# Patient Record
Sex: Female | Born: 1985 | Race: Black or African American | Hispanic: No | Marital: Single | Smoking: Current every day smoker
Health system: Southern US, Community
[De-identification: ages and names within clinical notes are randomized; demographics above are authoritative.]

---
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
## 2003-05-23 ENCOUNTER — Emergency Department (HOSPITAL_COMMUNITY): Admission: EM | Admit: 2003-05-23 | Discharge: 2003-05-23 | Payer: Self-pay | Admitting: *Deleted

## 2003-06-23 ENCOUNTER — Emergency Department (HOSPITAL_COMMUNITY): Admission: EM | Admit: 2003-06-23 | Discharge: 2003-06-24 | Payer: Self-pay | Admitting: Emergency Medicine

## 2004-04-23 ENCOUNTER — Emergency Department (HOSPITAL_COMMUNITY): Admission: EM | Admit: 2004-04-23 | Discharge: 2004-04-23 | Payer: Self-pay | Admitting: *Deleted

## 2004-09-01 ENCOUNTER — Emergency Department (HOSPITAL_COMMUNITY): Admission: EM | Admit: 2004-09-01 | Discharge: 2004-09-02 | Payer: Self-pay | Admitting: Emergency Medicine

## 2004-11-01 ENCOUNTER — Emergency Department (HOSPITAL_COMMUNITY): Admission: EM | Admit: 2004-11-01 | Discharge: 2004-11-01 | Payer: Self-pay | Admitting: Emergency Medicine

## 2005-01-21 ENCOUNTER — Emergency Department (HOSPITAL_COMMUNITY): Admission: EM | Admit: 2005-01-21 | Discharge: 2005-01-22 | Payer: Self-pay | Admitting: Emergency Medicine

## 2005-08-14 ENCOUNTER — Emergency Department (HOSPITAL_COMMUNITY): Admission: EM | Admit: 2005-08-14 | Discharge: 2005-08-14 | Payer: Self-pay | Admitting: Family Medicine

## 2005-08-19 ENCOUNTER — Ambulatory Visit: Payer: Self-pay | Admitting: *Deleted

## 2007-03-14 ENCOUNTER — Emergency Department: Payer: Self-pay | Admitting: Emergency Medicine

## 2007-04-16 ENCOUNTER — Emergency Department: Payer: Self-pay | Admitting: Emergency Medicine

## 2007-04-17 ENCOUNTER — Emergency Department: Payer: Self-pay | Admitting: Emergency Medicine

## 2008-09-11 ENCOUNTER — Emergency Department (HOSPITAL_COMMUNITY): Admission: EM | Admit: 2008-09-11 | Discharge: 2008-09-11 | Payer: Self-pay | Admitting: Emergency Medicine

## 2008-09-12 ENCOUNTER — Emergency Department (HOSPITAL_COMMUNITY): Admission: EM | Admit: 2008-09-12 | Discharge: 2008-09-12 | Payer: Self-pay | Admitting: Emergency Medicine

## 2009-01-18 ENCOUNTER — Emergency Department (HOSPITAL_COMMUNITY): Admission: EM | Admit: 2009-01-18 | Discharge: 2009-01-18 | Payer: Self-pay | Admitting: Emergency Medicine

## 2009-05-06 ENCOUNTER — Emergency Department (HOSPITAL_COMMUNITY): Admission: EM | Admit: 2009-05-06 | Discharge: 2009-05-07 | Payer: Self-pay | Admitting: Emergency Medicine

## 2009-05-09 ENCOUNTER — Emergency Department (HOSPITAL_COMMUNITY): Admission: EM | Admit: 2009-05-09 | Discharge: 2009-05-09 | Payer: Self-pay | Admitting: Emergency Medicine

## 2009-11-29 ENCOUNTER — Emergency Department (HOSPITAL_COMMUNITY): Admission: EM | Admit: 2009-11-29 | Discharge: 2009-11-29 | Payer: Self-pay | Admitting: Emergency Medicine

## 2010-09-03 LAB — DIFFERENTIAL
Basophils Absolute: 0.1 10*3/uL (ref 0.0–0.1)
Basophils Relative: 0 % (ref 0–1)
Lymphocytes Relative: 23 % (ref 12–46)
Monocytes Absolute: 1 10*3/uL (ref 0.1–1.0)
Neutro Abs: 10.4 10*3/uL — ABNORMAL HIGH (ref 1.7–7.7)

## 2010-09-03 LAB — BASIC METABOLIC PANEL
BUN: 6 mg/dL (ref 6–23)
CO2: 27 mEq/L (ref 19–32)
Calcium: 8.3 mg/dL — ABNORMAL LOW (ref 8.4–10.5)
Creatinine, Ser: 0.67 mg/dL (ref 0.4–1.2)
GFR calc non Af Amer: >60 mL/min (ref >60)
Glucose, Bld: 84 mg/dL (ref 70–99)

## 2010-09-03 LAB — MONONUCLEOSIS SCREEN: Mono Screen: NEGATIVE

## 2010-09-03 LAB — CBC
MCHC: 32.7 g/dL (ref 30.0–36.0)
Platelets: 297 10*3/uL (ref 150–400)
RDW: 18.1 % — ABNORMAL HIGH (ref 11.5–15.5)

## 2010-09-03 LAB — RAPID STREP SCREEN (MED CTR MEBANE ONLY): Streptococcus, Group A Screen (Direct): NEGATIVE

## 2010-09-11 LAB — URINALYSIS, ROUTINE W REFLEX MICROSCOPIC
Bilirubin Urine: NEGATIVE
Nitrite: NEGATIVE
Protein, ur: NEGATIVE mg/dL
Specific Gravity, Urine: 1.02 (ref 1.005–1.030)
Urobilinogen, UA: 1 mg/dL (ref 0.0–1.0)

## 2010-09-11 LAB — URINE MICROSCOPIC-ADD ON

## 2011-02-11 ENCOUNTER — Encounter (HOSPITAL_COMMUNITY): Payer: Self-pay | Admitting: Radiology

## 2011-02-11 ENCOUNTER — Emergency Department (HOSPITAL_COMMUNITY)
Admission: EM | Admit: 2011-02-11 | Discharge: 2011-02-11 | Disposition: A | Discharge status: Home / Self Care | Payer: Self-pay | Attending: Emergency Medicine | Admitting: Emergency Medicine

## 2011-02-11 ENCOUNTER — Emergency Department (HOSPITAL_COMMUNITY): Payer: Self-pay

## 2011-02-11 DIAGNOSIS — R079 Chest pain, unspecified: Secondary | ICD-10-CM | POA: Insufficient documentation

## 2011-02-11 DIAGNOSIS — R042 Hemoptysis: Secondary | ICD-10-CM | POA: Insufficient documentation

## 2011-02-11 DIAGNOSIS — S02401A Maxillary fracture, unspecified, initial encounter for closed fracture: Secondary | ICD-10-CM | POA: Insufficient documentation

## 2011-02-11 DIAGNOSIS — S02400A Malar fracture unspecified, initial encounter for closed fracture: Secondary | ICD-10-CM | POA: Insufficient documentation

## 2011-02-11 DIAGNOSIS — J45909 Unspecified asthma, uncomplicated: Secondary | ICD-10-CM | POA: Insufficient documentation

## 2011-02-11 DIAGNOSIS — R42 Dizziness and giddiness: Secondary | ICD-10-CM | POA: Insufficient documentation

## 2011-02-11 LAB — POCT PREGNANCY, URINE: Preg Test, Ur: NEGATIVE

## 2011-03-04 ENCOUNTER — Emergency Department (HOSPITAL_COMMUNITY)
Admission: EM | Admit: 2011-03-04 | Discharge: 2011-03-05 | Disposition: A | Discharge status: Home / Self Care | Payer: Self-pay | Attending: Emergency Medicine | Admitting: Emergency Medicine

## 2011-03-04 DIAGNOSIS — IMO0002 Reserved for concepts with insufficient information to code with codable children: Secondary | ICD-10-CM | POA: Insufficient documentation

## 2011-03-04 DIAGNOSIS — M542 Cervicalgia: Secondary | ICD-10-CM | POA: Insufficient documentation

## 2011-03-04 DIAGNOSIS — S0003XA Contusion of scalp, initial encounter: Secondary | ICD-10-CM | POA: Insufficient documentation

## 2011-03-04 DIAGNOSIS — R51 Headache: Secondary | ICD-10-CM | POA: Insufficient documentation

## 2011-03-04 DIAGNOSIS — S0083XA Contusion of other part of head, initial encounter: Secondary | ICD-10-CM | POA: Insufficient documentation

## 2011-03-05 ENCOUNTER — Emergency Department (HOSPITAL_COMMUNITY): Payer: Self-pay

## 2011-04-28 ENCOUNTER — Encounter (HOSPITAL_COMMUNITY): Payer: Self-pay | Admitting: Emergency Medicine

## 2011-04-28 ENCOUNTER — Emergency Department (HOSPITAL_COMMUNITY)
Admission: EM | Admit: 2011-04-28 | Discharge: 2011-04-29 | Disposition: A | Discharge status: Home / Self Care | Payer: Self-pay | Attending: Emergency Medicine | Admitting: Emergency Medicine

## 2011-04-28 DIAGNOSIS — M79609 Pain in unspecified limb: Secondary | ICD-10-CM | POA: Insufficient documentation

## 2011-04-28 DIAGNOSIS — S1093XA Contusion of unspecified part of neck, initial encounter: Secondary | ICD-10-CM | POA: Insufficient documentation

## 2011-04-28 DIAGNOSIS — T148XXA Other injury of unspecified body region, initial encounter: Secondary | ICD-10-CM | POA: Insufficient documentation

## 2011-04-28 DIAGNOSIS — R51 Headache: Secondary | ICD-10-CM | POA: Insufficient documentation

## 2011-04-28 DIAGNOSIS — S0003XA Contusion of scalp, initial encounter: Secondary | ICD-10-CM | POA: Insufficient documentation

## 2011-04-28 DIAGNOSIS — S0990XA Unspecified injury of head, initial encounter: Secondary | ICD-10-CM | POA: Insufficient documentation

## 2011-04-28 DIAGNOSIS — S0083XA Contusion of other part of head, initial encounter: Secondary | ICD-10-CM

## 2011-04-28 DIAGNOSIS — S0510XA Contusion of eyeball and orbital tissues, unspecified eye, initial encounter: Secondary | ICD-10-CM | POA: Insufficient documentation

## 2011-04-28 DIAGNOSIS — W503XXA Accidental bite by another person, initial encounter: Secondary | ICD-10-CM

## 2011-04-28 DIAGNOSIS — J45909 Unspecified asthma, uncomplicated: Secondary | ICD-10-CM | POA: Insufficient documentation
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
## 2011-04-28 NOTE — ED Notes (Signed)
PT Given ice pack

## 2011-04-29 MED ORDER — OXYCODONE-ACETAMINOPHEN 5-325 MG PO TABS: 1.0000 | ORAL_TABLET | ORAL | Status: AC | PRN

## 2011-04-29 MED ORDER — OXYCODONE-ACETAMINOPHEN 5-325 MG PO TABS
1.0000 | ORAL_TABLET | Freq: Once | ORAL | Status: AC
Start: 2011-04-29 — End: 2011-04-29
  Administered 2011-04-29: 1 via ORAL
  Filled 2011-04-29: qty 1

## 2011-04-29 MED ORDER — CLINDAMYCIN HCL 300 MG PO CAPS: 300.0000 mg | ORAL_CAPSULE | Freq: Four times a day (QID) | ORAL | Status: AC
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
## 2011-04-29 NOTE — ED Notes (Signed)
Pt. Refused CT scans
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
## 2011-04-29 NOTE — ED Notes (Signed)
Pt. Discharged to home, pt. Alert and oriented, ambulatory, gait steady, NAD noted

## 2011-07-03 ENCOUNTER — Emergency Department (HOSPITAL_COMMUNITY)
Admission: EM | Admit: 2011-07-03 | Discharge: 2011-07-03 | Disposition: A | Discharge status: Home / Self Care | Payer: Self-pay | Attending: Emergency Medicine | Admitting: Emergency Medicine

## 2011-07-03 ENCOUNTER — Encounter (HOSPITAL_COMMUNITY): Payer: Self-pay

## 2011-07-03 DIAGNOSIS — F17200 Nicotine dependence, unspecified, uncomplicated: Secondary | ICD-10-CM | POA: Insufficient documentation

## 2011-07-03 DIAGNOSIS — R599 Enlarged lymph nodes, unspecified: Secondary | ICD-10-CM | POA: Insufficient documentation

## 2011-07-03 DIAGNOSIS — J029 Acute pharyngitis, unspecified: Secondary | ICD-10-CM | POA: Insufficient documentation

## 2011-07-03 DIAGNOSIS — J45909 Unspecified asthma, uncomplicated: Secondary | ICD-10-CM | POA: Insufficient documentation

## 2011-07-03 MED ORDER — OXYCODONE-ACETAMINOPHEN 5-325 MG PO TABS
1.0000 | ORAL_TABLET | Freq: Once | ORAL | Status: AC
  Administered 2011-07-03: 1 via ORAL
  Filled 2011-07-03: qty 1

## 2011-07-03 MED ORDER — HYDROCODONE-ACETAMINOPHEN 5-500 MG PO TABS: 2.0000 | ORAL_TABLET | Freq: Four times a day (QID) | ORAL | Status: DC | PRN

## 2011-07-03 MED ORDER — AZITHROMYCIN 250 MG PO TABS: 250.0000 mg | ORAL_TABLET | Freq: Every day | ORAL | Status: AC

## 2011-07-03 NOTE — ED Notes (Signed)
Sore throat for 2 weeks, pt. Has a hx of  Strep throat, and tonsilitis

## 2011-07-03 NOTE — ED Provider Notes (Signed)
History     CSN: [id number]  Arrival date & time 07/03/11  1816   First MD Initiated Contact with Patient 07/03/11 1849      Chief Complaint  Patient presents with  . Sore Throat    (Consider location/radiation/quality/duration/timing/severity/associated sxs/prior treatment) Patient is a 26 y.o. female presenting with pharyngitis.  Sore Throat This is a recurrent problem. The current episode started 1 to 4 weeks ago. The problem occurs constantly. The problem has been gradually worsening. Associated symptoms include a sore throat and swollen glands. The symptoms are aggravated by swallowing. She has tried nothing for the symptoms.    Past Medical History  Diagnosis Date  . Asthma     History reviewed. No pertinent past surgical history.  History reviewed. No pertinent family history.  History  Substance Use Topics  . Smoking status: Current Everyday Smoker  . Smokeless tobacco: Not on file  . Alcohol Use: Yes    OB History    Grav Para Term Preterm Abortions TAB SAB Ect Mult Living                  Review of Systems  HENT: Positive for sore throat.   All other systems reviewed and are negative.    Allergies  Penicillins  Home Medications  No current outpatient prescriptions on file.  BP 111/71  Pulse 95  Temp(Src) 98.7 F (37.1 C) (Oral)  Resp 12  Ht 5\' 6"  (1.676 m)  Wt 140 lb (63.504 kg)  BMI 22.60 kg/m2  SpO2 98%  LMP 06/17/2011  Physical Exam  Nursing note and vitals reviewed. Constitutional: She is oriented to person, place, and time. She appears well-developed and well-nourished.  HENT:  Head: Normocephalic.  Mouth/Throat: Oropharyngeal exudate present.  Eyes: Pupils are equal, round, and reactive to light.  Neck: Normal range of motion. Neck supple.  Cardiovascular: Normal rate, regular rhythm, normal heart sounds and intact distal pulses.   Pulmonary/Chest: Effort normal and breath sounds normal.  Abdominal: Soft. Bowel sounds are  normal.  Musculoskeletal: Normal range of motion.  Neurological: She is alert and oriented to person, place, and time.  Skin: Skin is warm and dry.  Psychiatric: She has a normal mood and affect.    ED Course  Procedures (including critical care time)   Labs Reviewed  RAPID STREP SCREEN   No results found.   No diagnosis found.   Pharyngitis. MDM          Jimmye Norman, NP 07/03/11 2039

## 2011-07-04 LAB — STREP A DNA PROBE: Group A Strep Probe: NEGATIVE

## 2011-07-04 NOTE — ED Provider Notes (Signed)
Medical screening examination/treatment/procedure(s) were performed by non-physician practitioner and as supervising physician I was immediately available for consultation/collaboration.   Anwar Crill A. Jermon Chalfant, MD 07/04/11 1454 

## 2011-07-12 ENCOUNTER — Emergency Department (HOSPITAL_COMMUNITY)
Admission: EM | Admit: 2011-07-12 | Discharge: 2011-07-12 | Disposition: A | Discharge status: Home / Self Care | Payer: Self-pay | Attending: Emergency Medicine | Admitting: Emergency Medicine

## 2011-07-12 ENCOUNTER — Encounter (HOSPITAL_COMMUNITY): Payer: Self-pay | Admitting: *Deleted

## 2011-07-12 DIAGNOSIS — R45851 Suicidal ideations: Secondary | ICD-10-CM | POA: Insufficient documentation

## 2011-07-12 DIAGNOSIS — R000 Tachycardia, unspecified: Secondary | ICD-10-CM | POA: Insufficient documentation

## 2011-07-12 DIAGNOSIS — F17200 Nicotine dependence, unspecified, uncomplicated: Secondary | ICD-10-CM | POA: Insufficient documentation

## 2011-07-12 DIAGNOSIS — F3289 Other specified depressive episodes: Secondary | ICD-10-CM | POA: Insufficient documentation

## 2011-07-12 DIAGNOSIS — F329 Major depressive disorder, single episode, unspecified: Secondary | ICD-10-CM | POA: Insufficient documentation

## 2011-07-12 DIAGNOSIS — F1910 Other psychoactive substance abuse, uncomplicated: Secondary | ICD-10-CM | POA: Insufficient documentation

## 2011-07-12 LAB — COMPREHENSIVE METABOLIC PANEL
ALT: 32 U/L (ref 0–35)
AST: 29 U/L (ref 0–37)
Albumin: 4.2 g/dL (ref 3.5–5.2)
Calcium: 9.4 mg/dL (ref 8.4–10.5)
Creatinine, Ser: 0.68 mg/dL (ref 0.50–1.10)
GFR calc non Af Amer: >90 mL/min (ref >90)
Sodium: 140 mEq/L (ref 135–145)
Total Protein: 8.3 g/dL (ref 6.0–8.3)

## 2011-07-12 LAB — RAPID URINE DRUG SCREEN, HOSP PERFORMED
Amphetamines: NOT DETECTED
Barbiturates: NOT DETECTED
Benzodiazepines: NOT DETECTED
Cocaine: NOT DETECTED
Opiates: NOT DETECTED
Tetrahydrocannabinol: POSITIVE — AB

## 2011-07-12 LAB — CBC
MCH: 29.9 pg (ref 26.0–34.0)
MCV: 86.9 fL (ref 78.0–100.0)
Platelets: 442 10*3/uL — ABNORMAL HIGH (ref 150–400)
RBC: 4.28 MIL/uL (ref 3.87–5.11)
RDW: 15.5 % (ref 11.5–15.5)

## 2011-07-12 MED ORDER — ACETAMINOPHEN 325 MG PO TABS: 650.0000 mg | ORAL_TABLET | ORAL | Status: DC | PRN

## 2011-07-12 MED ORDER — IBUPROFEN 600 MG PO TABS: 600.0000 mg | ORAL_TABLET | Freq: Three times a day (TID) | ORAL | Status: DC | PRN

## 2011-07-12 MED ORDER — NICOTINE 21 MG/24HR TD PT24: 21.0000 mg | MEDICATED_PATCH | Freq: Every day | TRANSDERMAL | Status: DC

## 2011-07-12 MED ORDER — ZIPRASIDONE MESYLATE 20 MG IM SOLR
INTRAMUSCULAR | Status: AC
  Filled 2011-07-12: qty 20

## 2011-07-12 MED ORDER — ONDANSETRON HCL 4 MG PO TABS: 4.0000 mg | ORAL_TABLET | Freq: Three times a day (TID) | ORAL | Status: DC | PRN

## 2011-07-12 MED ORDER — ZIPRASIDONE MESYLATE 20 MG IM SOLR
10.0000 mg | Freq: Once | INTRAMUSCULAR | Status: AC
  Administered 2011-07-12: 03:00:00 via INTRAMUSCULAR

## 2011-07-12 MED ORDER — LORAZEPAM 1 MG PO TABS: 1.0000 mg | ORAL_TABLET | Freq: Three times a day (TID) | ORAL | Status: DC | PRN

## 2011-07-12 MED ORDER — ALUM & MAG HYDROXIDE-SIMETH 200-200-20 MG/5ML PO SUSP: 30.0000 mL | ORAL | Status: DC | PRN

## 2011-07-12 NOTE — ED Notes (Signed)
Belongings returned, pt denies si/hi/avh at this time. Pt encouraged to return for any return of suicidal thoughts/urges.  Pt verbalized understanding.

## 2011-07-12 NOTE — ED Notes (Signed)
telepsych info faxed 
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
## 2011-07-12 NOTE — ED Notes (Signed)
Awake,  Drinking soda, waiting for telepsych
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
## 2011-07-12 NOTE — ED Notes (Signed)
Lab bedside.

## 2011-07-12 NOTE — ED Notes (Signed)
Vital signs stable. 
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
## 2011-07-12 NOTE — ED Notes (Signed)
Patient is resting comfortably. 

## 2011-07-12 NOTE — ED Notes (Signed)
Pt states that she sometimes feels hopeless and when she drinks ETOH, she often thinks about hurting or killing herself.  Pt states that she has always had such feelings but has never sought help as she feels that she can change her thinking without medication.  Pt unaware of family hx of depression or other psychiatric illnesses as she just met her father for the first time x 1 week ago and her mother has been deported.  Pt is tearful at time of questioning and states that she wishes to leave and that we cannot make her stay.  Pt informed that she must stay because she has been involuntarily committed.  Pt attempts to leave and is escorted back to her room via [employer].  Pt still tries to leave and is given Geodon.
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
## 2011-07-12 NOTE — ED Notes (Signed)
ACT into seee

## 2011-07-12 NOTE — ED Notes (Signed)
Up to the desk on the phone 

## 2011-07-12 NOTE — ED Notes (Signed)
telepsych complete, pt tolerated well

## 2011-07-12 NOTE — ED Notes (Signed)
Pt given community referals for follow up

## 2011-07-12 NOTE — ED Notes (Signed)
Up to the bathroom 

## 2011-07-12 NOTE — ED Notes (Signed)
Patient denies pain and is resting comfortably.  
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
## 2011-12-02 ENCOUNTER — Encounter (HOSPITAL_COMMUNITY): Payer: Self-pay | Admitting: *Deleted

## 2011-12-02 ENCOUNTER — Emergency Department (HOSPITAL_COMMUNITY)
Admission: EM | Admit: 2011-12-02 | Discharge: 2011-12-02 | Disposition: A | Discharge status: Home / Self Care | Payer: Self-pay | Attending: Emergency Medicine | Admitting: Emergency Medicine

## 2011-12-02 DIAGNOSIS — F17200 Nicotine dependence, unspecified, uncomplicated: Secondary | ICD-10-CM | POA: Insufficient documentation

## 2011-12-02 DIAGNOSIS — J020 Streptococcal pharyngitis: Secondary | ICD-10-CM | POA: Insufficient documentation

## 2011-12-02 MED ORDER — HYDROCODONE-ACETAMINOPHEN 7.5-500 MG/15ML PO SOLN: 15.0000 mL | Freq: Four times a day (QID) | ORAL | Status: DC | PRN

## 2011-12-02 MED ORDER — AZITHROMYCIN 250 MG PO TABS: 250.0000 mg | ORAL_TABLET | Freq: Every day | ORAL | Status: AC

## 2011-12-02 NOTE — ED Notes (Signed)
Patient reports onset of sore throat and white patches on her throat since Sunday.  She denies fever

## 2011-12-02 NOTE — ED Provider Notes (Signed)
History   This chart was scribed for Gwyneth Sprout, MD by Melba Coon. The patient was seen in room TR11C/TR11C and the patient's care was started at 1:45PM.    CSN: [id number]  Arrival date & time 12/02/11  1327   First MD Initiated Contact with Patient 12/02/11 1337      Chief Complaint  Patient presents with  . Sore Throat    (Consider location/radiation/quality/duration/timing/severity/associated sxs/prior treatment) HPI Brittney Henderson is a 26 y.o. female who presents to the Emergency Department complaining of constant, moderate to severe sore throat with an onset 2 days ago. Pt has had similar problems 6 months ago; Hx of tonsillar problems. Productive cough present. No HA, fever, neck pain, rash, back pain, CP, SOB, abd pain, n/v/d, dysuria, or extremity pain, edema, weakness, numbness, or tingling. Allergic to pencillins. No other pertinent medical symptoms.  Past Medical History  Diagnosis Date  . Asthma     History reviewed. No pertinent past surgical history.  No family history on file.  History  Substance Use Topics  . Smoking status: Current Everyday Smoker  . Smokeless tobacco: Not on file  . Alcohol Use: Yes    OB History    Grav Para Term Preterm Abortions TAB SAB Ect Mult Living                  Review of Systems 10 Systems reviewed and all are negative for acute change except as noted in the HPI.   Allergies  Penicillins  Home Medications  No current outpatient prescriptions on file.  BP 119/75  Pulse 95  Temp 98.5 F (36.9 C) (Oral)  Resp 16  Ht 5\' 6"  (1.676 m)  Wt 141 lb (63.957 kg)  BMI 22.76 kg/m2  SpO2 97%  Physical Exam  Nursing note and vitals reviewed. Constitutional: She is oriented to person, place, and time. She appears well-developed and well-nourished. No distress.  HENT:  Head: Normocephalic and atraumatic.  Right Ear: External ear normal.  Left Ear: External ear normal.       White exudate on bilateral  tonsils.  Eyes: EOM are normal.  Neck: Normal range of motion. Neck supple. No tracheal deviation present.  Cardiovascular: Normal rate.   Pulmonary/Chest: Effort normal. No respiratory distress.  Abdominal: There is no tenderness.  Musculoskeletal: Normal range of motion. She exhibits no edema and no tenderness.  Lymphadenopathy:    She has cervical adenopathy.  Neurological: She is alert and oriented to person, place, and time.  Skin: Skin is warm and dry.  Psychiatric: She has a normal mood and affect. Her behavior is normal.    ED Course  Procedures (including critical care time)  DIAGNOSTIC STUDIES: Oxygen Saturation is 97% on room air, normal by my interpretation.    COORDINATION OF CARE:  1:49PM - EDMD will Rx abx and pain meds for the pt. Pt will refer pt to a ENT specialist.  Labs Reviewed - No data to display No results found.   1. Strep pharyngitis       MDM   Patient with clinical evidence of strep throat (no rapid strep done) with bilateral exudates on her tonsils and cervical adenopathy. Patient has had multiple episodes of strep pharyngitis at least twice a year for multiple years. She was given azithromycin and pain control. She's allergic to penicillin. Given her followup with ENT for possible tonsillectomy. I personally performed the services described in this documentation, which was scribed in my presence.  The recorded information  has been reviewed and considered.         Gwyneth Sprout, MD 12/02/11 1400

## 2011-12-09 ENCOUNTER — Encounter (HOSPITAL_COMMUNITY): Payer: Self-pay | Admitting: Physical Medicine and Rehabilitation

## 2011-12-09 ENCOUNTER — Emergency Department (HOSPITAL_COMMUNITY)
Admission: EM | Admit: 2011-12-09 | Discharge: 2011-12-09 | Discharge status: Against Medical Advice | Payer: Self-pay | Attending: Emergency Medicine | Admitting: Emergency Medicine

## 2011-12-09 DIAGNOSIS — T148XXA Other injury of unspecified body region, initial encounter: Secondary | ICD-10-CM | POA: Insufficient documentation

## 2011-12-09 DIAGNOSIS — X58XXXA Exposure to other specified factors, initial encounter: Secondary | ICD-10-CM | POA: Insufficient documentation
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
## 2011-12-10 ENCOUNTER — Emergency Department (HOSPITAL_COMMUNITY)
Admission: EM | Admit: 2011-12-10 | Discharge: 2011-12-10 | Disposition: A | Discharge status: Home / Self Care | Payer: Self-pay | Attending: Emergency Medicine | Admitting: Emergency Medicine

## 2011-12-10 ENCOUNTER — Encounter (HOSPITAL_COMMUNITY): Payer: Self-pay | Admitting: *Deleted

## 2011-12-10 DIAGNOSIS — X789XXA Intentional self-harm by unspecified sharp object, initial encounter: Secondary | ICD-10-CM | POA: Insufficient documentation

## 2011-12-10 DIAGNOSIS — S81009A Unspecified open wound, unspecified knee, initial encounter: Secondary | ICD-10-CM | POA: Insufficient documentation

## 2011-12-10 DIAGNOSIS — Z23 Encounter for immunization: Secondary | ICD-10-CM | POA: Insufficient documentation

## 2011-12-10 DIAGNOSIS — IMO0002 Reserved for concepts with insufficient information to code with codable children: Secondary | ICD-10-CM

## 2011-12-10 MED ORDER — IBUPROFEN 400 MG PO TABS
600.0000 mg | ORAL_TABLET | Freq: Once | ORAL | Status: AC
  Administered 2011-12-10: 600 mg via ORAL
  Filled 2011-12-10: qty 1

## 2011-12-10 MED ORDER — TETANUS-DIPHTH-ACELL PERTUSSIS 5-2.5-18.5 LF-MCG/0.5 IM SUSP
0.5000 mL | Freq: Once | INTRAMUSCULAR | Status: AC
Start: 2011-12-10 — End: 2011-12-10
  Administered 2011-12-10: 0.5 mL via INTRAMUSCULAR
  Filled 2011-12-10: qty 0.5

## 2011-12-10 MED ORDER — IBUPROFEN 600 MG PO TABS: 600.0000 mg | ORAL_TABLET | Freq: Four times a day (QID) | ORAL | Status: AC | PRN

## 2011-12-10 NOTE — ED Notes (Signed)
Pt was here yesterday for laceration to right ankle, left ama, returned today to have it sutured. Bandage in place pta.
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
## 2012-02-16 ENCOUNTER — Emergency Department (HOSPITAL_COMMUNITY)
Admission: EM | Admit: 2012-02-16 | Discharge: 2012-02-17 | Disposition: A | Discharge status: Home / Self Care | Payer: Self-pay | Attending: Emergency Medicine | Admitting: Emergency Medicine

## 2012-02-16 ENCOUNTER — Encounter (HOSPITAL_COMMUNITY): Payer: Self-pay | Admitting: Emergency Medicine

## 2012-02-16 DIAGNOSIS — K089 Disorder of teeth and supporting structures, unspecified: Secondary | ICD-10-CM | POA: Insufficient documentation

## 2012-02-16 NOTE — ED Notes (Signed)
Pt c/o right sided dental pain x 5 days 

## 2012-02-18 ENCOUNTER — Encounter (HOSPITAL_COMMUNITY): Payer: Self-pay | Admitting: *Deleted

## 2012-02-18 ENCOUNTER — Emergency Department (INDEPENDENT_AMBULATORY_CARE_PROVIDER_SITE_OTHER)
Admission: EM | Admit: 2012-02-18 | Discharge: 2012-02-18 | Disposition: A | Discharge status: Home / Self Care | Payer: Self-pay | Source: Home / Self Care | Attending: Emergency Medicine | Admitting: Emergency Medicine

## 2012-02-18 DIAGNOSIS — K0889 Other specified disorders of teeth and supporting structures: Secondary | ICD-10-CM

## 2012-02-18 DIAGNOSIS — J069 Acute upper respiratory infection, unspecified: Secondary | ICD-10-CM

## 2012-02-18 DIAGNOSIS — J45901 Unspecified asthma with (acute) exacerbation: Secondary | ICD-10-CM

## 2012-02-18 DIAGNOSIS — K089 Disorder of teeth and supporting structures, unspecified: Secondary | ICD-10-CM

## 2012-02-18 MED ORDER — IPRATROPIUM BROMIDE 0.02 % IN SOLN
0.5000 mg | Freq: Once | RESPIRATORY_TRACT | Status: AC
  Administered 2012-02-18: 0.5 mg via RESPIRATORY_TRACT

## 2012-02-18 MED ORDER — IBUPROFEN 800 MG PO TABS
ORAL_TABLET | ORAL | Status: AC
  Filled 2012-02-18: qty 1

## 2012-02-18 MED ORDER — ALBUTEROL SULFATE (5 MG/ML) 0.5% IN NEBU
5.0000 mg | INHALATION_SOLUTION | Freq: Once | RESPIRATORY_TRACT | Status: AC
  Administered 2012-02-18: 5 mg via RESPIRATORY_TRACT

## 2012-02-18 MED ORDER — CETIRIZINE-PSEUDOEPHEDRINE ER 5-120 MG PO TB12: 1.0000 | ORAL_TABLET | Freq: Every day | ORAL | Status: DC

## 2012-02-18 MED ORDER — PREDNISONE 20 MG PO TABS: 20.0000 mg | ORAL_TABLET | Freq: Every day | ORAL | Status: DC

## 2012-02-18 MED ORDER — ALBUTEROL SULFATE HFA 108 (90 BASE) MCG/ACT IN AERS: 1.0000 | INHALATION_SPRAY | Freq: Four times a day (QID) | RESPIRATORY_TRACT | Status: DC | PRN

## 2012-02-18 MED ORDER — ALBUTEROL SULFATE (5 MG/ML) 0.5% IN NEBU
INHALATION_SOLUTION | RESPIRATORY_TRACT | Status: AC
  Filled 2012-02-18: qty 1

## 2012-02-18 MED ORDER — IBUPROFEN 800 MG PO TABS
800.0000 mg | ORAL_TABLET | Freq: Once | ORAL | Status: AC
  Administered 2012-02-18: 800 mg via ORAL

## 2012-02-18 MED ORDER — IBUPROFEN 800 MG PO TABS: 800.0000 mg | ORAL_TABLET | Freq: Three times a day (TID) | ORAL | Status: DC

## 2012-02-18 NOTE — ED Provider Notes (Signed)
History     CSN: [id number]  Arrival date & time 02/18/12  1315   First MD Initiated Contact with Patient 02/18/12 1319      Chief Complaint  Patient presents with  . Shortness of Breath    (Consider location/radiation/quality/duration/timing/severity/associated sxs/prior treatment) HPI Comments: Patient presents urgent care complaining of congestion and cough and wheezing or shortness of breath for the last 5-7 days. She also is requesting to have pain medicine as she is having a right lower toothache. Patient does admit that she continues to smoke every day, denies any abdominal pain, fevers or chest pains.  Patient is a 26 y.o. female presenting with shortness of breath. The history is provided by the patient.  Shortness of Breath  The current episode started today. The onset was gradual. The problem has been gradually worsening. The problem is moderate. Associated symptoms include rhinorrhea, sore throat, cough, shortness of breath and wheezing. Pertinent negatives include no chest pain, no chest pressure, no orthopnea and no fever.    Past Medical History  Diagnosis Date  . Asthma     History reviewed. No pertinent past surgical history.  No family history on file.  History  Substance Use Topics  . Smoking status: Current Every Day Smoker  . Smokeless tobacco: Not on file  . Alcohol Use: Yes    OB History    Grav Para Term Preterm Abortions TAB SAB Ect Mult Living                  Review of Systems  Constitutional: Positive for activity change. Negative for fever, chills, appetite change and fatigue.  HENT: Positive for sore throat and rhinorrhea.   Eyes: Negative for discharge and itching.  Respiratory: Positive for cough, shortness of breath and wheezing.   Cardiovascular: Negative for chest pain and orthopnea.  Genitourinary: Negative for dysuria and dyspareunia.  Skin: Negative for color change and rash.  Neurological: Negative for dizziness.     Allergies  Penicillins  Home Medications   Current Outpatient Rx  Name Route Sig Dispense Refill  . ALBUTEROL IN Inhalation Inhale into the lungs.      BP 118/72  Pulse 86  Temp 98.7 F (37.1 C) (Oral)  Resp 24  SpO2 100%  Physical Exam  Nursing note and vitals reviewed. Constitutional: Vital signs are normal. She appears well-developed and well-nourished.  Non-toxic appearance. She does not have a sickly appearance. No distress.  HENT:  Head: Normocephalic.  Right Ear: Tympanic membrane normal.  Left Ear: Tympanic membrane normal.  Mouth/Throat: Uvula is midline. Posterior oropharyngeal erythema and tonsillar abscesses present. No oropharyngeal exudate.  Eyes: Conjunctivae normal are normal. Right eye exhibits no discharge. Left eye exhibits no discharge.  Neck: Neck supple. No JVD present.  Pulmonary/Chest: No respiratory distress. She has wheezes. She has no rales. She exhibits no tenderness.  Lymphadenopathy:    She has no cervical adenopathy.  Skin: Skin is warm. No erythema.    ED Course  Procedures (including critical care time)  Labs Reviewed - No data to display No results found.   No diagnosis found.    MDM  Asthma exacerbation. Also with coexistent dental pain. Exam was consistent with asthma with expiratory wheezing. No respiratory distress but actively symptomatic. On the other hand patient exhibited a normal oral exam with no signs of dental infection requesting narcotic pain medicines which I have refused to provide have explained that she can take other medicines for pain management such as Tylenol  and Motrin. Patient had been given a referral to followup with a dentist. Was treated with nebulizer treatments.      Jimmie Molly, MD 02/18/12 1400

## 2012-02-18 NOTE — ED Notes (Signed)
Pt  Reports  Symptoms  Of  Shortness of  Breath        She  Is  A  Smoker        She reports  Was  On  Albuterol in  Past    Not  Taking  Now            She reports  The   Breathing  Symptoms  Started    Today  -      She  Also  Reports  Symptoms  Of   Bottom  r  Toothache      X  5    7  Days  -  Pt  Was  In  Er  For the toothache  sev  Days  Ago         She  States  She is  On no  meds

## 2012-02-20 MED ORDER — PREDNISONE 20 MG PO TABS: 40.0000 mg | ORAL_TABLET | Freq: Every day | ORAL | Status: DC

## 2012-02-22 ENCOUNTER — Emergency Department (HOSPITAL_COMMUNITY)
Admission: EM | Admit: 2012-02-22 | Discharge: 2012-02-22 | Disposition: A | Discharge status: Home / Self Care | Payer: Self-pay | Attending: Emergency Medicine | Admitting: Emergency Medicine

## 2012-02-22 ENCOUNTER — Emergency Department (HOSPITAL_COMMUNITY): Payer: Self-pay

## 2012-02-22 ENCOUNTER — Encounter (HOSPITAL_COMMUNITY): Payer: Self-pay | Admitting: Emergency Medicine

## 2012-02-22 ENCOUNTER — Other Ambulatory Visit (HOSPITAL_COMMUNITY): Payer: Self-pay

## 2012-02-22 DIAGNOSIS — S0181XA Laceration without foreign body of other part of head, initial encounter: Secondary | ICD-10-CM

## 2012-02-22 DIAGNOSIS — H5710 Ocular pain, unspecified eye: Secondary | ICD-10-CM | POA: Insufficient documentation

## 2012-02-22 DIAGNOSIS — S40029A Contusion of unspecified upper arm, initial encounter: Secondary | ICD-10-CM | POA: Insufficient documentation

## 2012-02-22 DIAGNOSIS — R51 Headache: Secondary | ICD-10-CM | POA: Insufficient documentation

## 2012-02-22 DIAGNOSIS — H02849 Edema of unspecified eye, unspecified eyelid: Secondary | ICD-10-CM | POA: Insufficient documentation

## 2012-02-22 DIAGNOSIS — S0180XA Unspecified open wound of other part of head, initial encounter: Secondary | ICD-10-CM | POA: Insufficient documentation

## 2012-02-22 DIAGNOSIS — H1130 Conjunctival hemorrhage, unspecified eye: Secondary | ICD-10-CM | POA: Insufficient documentation

## 2012-02-22 MED ORDER — AMOXICILLIN-POT CLAVULANATE 875-125 MG PO TABS: 1.0000 | ORAL_TABLET | Freq: Two times a day (BID) | ORAL | Status: DC

## 2012-02-22 MED ORDER — TRAMADOL HCL 50 MG PO TABS: 50.0000 mg | ORAL_TABLET | Freq: Four times a day (QID) | ORAL | Status: DC | PRN

## 2012-02-22 MED ORDER — IBUPROFEN 800 MG PO TABS
800.0000 mg | ORAL_TABLET | Freq: Once | ORAL | Status: AC
  Administered 2012-02-22: 800 mg via ORAL
  Filled 2012-02-22: qty 1
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
## 2012-02-23 NOTE — ED Provider Notes (Signed)
Medical screening examination/treatment/procedure(s) were performed by non-physician practitioner and as supervising physician I was immediately available for consultation/collaboration.  Olivia Mackie, MD 02/23/12 [phone]

## 2012-03-02 ENCOUNTER — Emergency Department (HOSPITAL_COMMUNITY)
Admission: EM | Admit: 2012-03-02 | Discharge: 2012-03-02 | Disposition: A | Discharge status: Home / Self Care | Payer: Self-pay | Attending: Emergency Medicine | Admitting: Emergency Medicine

## 2012-03-02 DIAGNOSIS — IMO0002 Reserved for concepts with insufficient information to code with codable children: Secondary | ICD-10-CM

## 2012-03-02 DIAGNOSIS — Z4802 Encounter for removal of sutures: Secondary | ICD-10-CM | POA: Insufficient documentation

## 2012-03-02 DIAGNOSIS — F17200 Nicotine dependence, unspecified, uncomplicated: Secondary | ICD-10-CM | POA: Insufficient documentation

## 2012-03-02 NOTE — ED Notes (Signed)
The pt  Has sutures in her rt forehead that has been there for 10 days.  Incision well-healed
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
## 2015-10-21 ENCOUNTER — Encounter (HOSPITAL_COMMUNITY): Payer: Self-pay | Admitting: Family Medicine

## 2015-10-21 ENCOUNTER — Emergency Department (HOSPITAL_COMMUNITY)
Admission: EM | Admit: 2015-10-21 | Discharge: 2015-10-22 | Disposition: A | Discharge status: Home / Self Care | Payer: Self-pay | Attending: Emergency Medicine | Admitting: Emergency Medicine

## 2015-10-21 DIAGNOSIS — Z79899 Other long term (current) drug therapy: Secondary | ICD-10-CM | POA: Insufficient documentation

## 2015-10-21 DIAGNOSIS — J45909 Unspecified asthma, uncomplicated: Secondary | ICD-10-CM | POA: Insufficient documentation

## 2015-10-21 DIAGNOSIS — F17210 Nicotine dependence, cigarettes, uncomplicated: Secondary | ICD-10-CM | POA: Insufficient documentation

## 2015-10-21 DIAGNOSIS — N939 Abnormal uterine and vaginal bleeding, unspecified: Secondary | ICD-10-CM | POA: Insufficient documentation

## 2015-10-21 NOTE — ED Notes (Signed)
Pt reports she is experiencing vaginal bleeding that started on Friday. Pt reports it is small blood clots and going through 2 pads in a hour since yesterday. Pt reports she had stopped her menstrual cycle on Thursday. Lower abd pain.  Intermittent lightheaded but denies any nausea, vomiting, fever, and dizziness.

## 2015-10-22 LAB — URINALYSIS, ROUTINE W REFLEX MICROSCOPIC
BILIRUBIN URINE: NEGATIVE
Glucose, UA: NEGATIVE mg/dL
KETONES UR: NEGATIVE mg/dL
LEUKOCYTES UA: NEGATIVE
NITRITE: NEGATIVE
PH: 7 (ref 5.0–8.0)
PROTEIN: NEGATIVE mg/dL
SPECIFIC GRAVITY, URINE: 1.029 (ref 1.005–1.030)

## 2015-10-22 LAB — COMPREHENSIVE METABOLIC PANEL
ALBUMIN: 4.2 g/dL (ref 3.5–5.0)
ALK PHOS: 55 U/L (ref 38–126)
ALT: 19 U/L (ref 14–54)
ANION GAP: 7 (ref 5–15)
AST: 23 U/L (ref 15–41)
BILIRUBIN TOTAL: 0.2 mg/dL — AB (ref 0.3–1.2)
BUN: 13 mg/dL (ref 6–20)
CALCIUM: 8.6 mg/dL — AB (ref 8.9–10.3)
CO2: 25 mmol/L (ref 22–32)
Chloride: 107 mmol/L (ref 101–111)
Creatinine, Ser: 0.83 mg/dL (ref 0.44–1.00)
GFR calc non Af Amer: >60 mL/min (ref >60)
GLUCOSE: 92 mg/dL (ref 65–99)
POTASSIUM: 3.4 mmol/L — AB (ref 3.5–5.1)
Sodium: 139 mmol/L (ref 135–145)
TOTAL PROTEIN: 7.3 g/dL (ref 6.5–8.1)

## 2015-10-22 LAB — CBC
HEMATOCRIT: 38.8 % (ref 36.0–46.0)
HEMOGLOBIN: 12.8 g/dL (ref 12.0–15.0)
MCH: 31.8 pg (ref 26.0–34.0)
MCHC: 33 g/dL (ref 30.0–36.0)
MCV: 96.5 fL (ref 78.0–100.0)
Platelets: 395 10*3/uL (ref 150–400)
RBC: 4.02 MIL/uL (ref 3.87–5.11)
RDW: 13.2 % (ref 11.5–15.5)
WBC: 12.6 10*3/uL — AB (ref 4.0–10.5)

## 2015-10-22 LAB — URINE MICROSCOPIC-ADD ON: WBC UA: NONE SEEN WBC/hpf (ref 0–5)

## 2015-10-22 LAB — WET PREP, GENITAL
Clue Cells Wet Prep HPF POC: NONE SEEN
Sperm: NONE SEEN
Trich, Wet Prep: NONE SEEN
Yeast Wet Prep HPF POC: NONE SEEN

## 2015-10-22 LAB — GC/CHLAMYDIA PROBE AMP (~~LOC~~) NOT AT ARMC
CHLAMYDIA, DNA PROBE: NEGATIVE
NEISSERIA GONORRHEA: NEGATIVE

## 2015-10-22 LAB — POC URINE PREG, ED: Preg Test, Ur: NEGATIVE

## 2015-10-22 MED ORDER — IBUPROFEN 800 MG PO TABS: 800.0000 mg | ORAL_TABLET | Freq: Three times a day (TID) | ORAL | Status: DC

## 2015-10-22 MED ORDER — MEDROXYPROGESTERONE ACETATE 10 MG PO TABS
10.0000 mg | ORAL_TABLET | Freq: Every day | ORAL | Status: DC
  Administered 2015-10-22: 10 mg via ORAL
  Filled 2015-10-22: qty 1

## 2015-10-22 MED ORDER — TRAMADOL HCL 50 MG PO TABS: 50.0000 mg | ORAL_TABLET | Freq: Four times a day (QID) | ORAL | Status: DC | PRN

## 2015-10-22 MED ORDER — MEDROXYPROGESTERONE ACETATE 10 MG PO TABS: 10.0000 mg | ORAL_TABLET | Freq: Every day | ORAL | Status: DC

## 2015-10-22 NOTE — Discharge Instructions (Signed)

## 2015-10-22 NOTE — ED Provider Notes (Signed)
CSN: [id number]     Arrival date & time 10/21/15  2249 History  By signing my name below, I, Brittney Henderson, attest that this documentation has been prepared under the direction and in the presence of Gilda Creasehristopher J Khole Arterburn, MD. Electronically Signed: Bethel BornBritney Henderson, ED Scribe. 10/22/2015. 3:24 AM   Chief Complaint  Patient presents with  . Vaginal Bleeding    The history is provided by the patient. No language interpreter was used.   Brittney Henderson is a 30 y.o. female who presents to the Emergency Department complaining of heavy vaginal bleeding with onset 4 days ago. She has been using 2 pads per hour. Pt states that her menstrual period ended days prior to the onset of her current symptoms. She had similar episodes of bleeding last year after she stopped getting birth control injections.  Associated symptoms include intermittent lightheadedness and lower abdominal cramping.  Past Medical History  Diagnosis Date  . Asthma    History reviewed. No pertinent past surgical history. History reviewed. No pertinent family history. Social History  Substance Use Topics  . Smoking status: Current Every Day Smoker -- 0.25 packs/day    Types: Cigarettes  . Smokeless tobacco: None  . Alcohol Use: Yes     Comment: 1-2 times a week.    OB History    No data available     Review of Systems  Gastrointestinal: Positive for abdominal pain.  Genitourinary: Positive for vaginal bleeding.  All other systems reviewed and are negative.     Allergies  Penicillins  Home Medications   Prior to Admission medications   Medication Sig Start Date End Date Taking? Authorizing Provider  ibuprofen (ADVIL,MOTRIN) 800 MG tablet Take 1 tablet (800 mg total) by mouth 3 (three) times daily. 10/22/15   Gilda Creasehristopher J Kailei Cowens, MD  medroxyPROGESTERone (PROVERA) 10 MG tablet Take 1 tablet (10 mg total) by mouth daily. 10/22/15   Gilda Creasehristopher J Panayiotis Rainville, MD  naphazoline-glycerin (CLEAR EYES) 0.012-0.2 % SOLN  Place 1-2 drops into the right eye daily as needed. For redness    Historical Provider, MD  naproxen sodium (ANAPROX) 220 MG tablet Take 880 mg by mouth once.    Historical Provider, MD  traMADol (ULTRAM) 50 MG tablet Take 1 tablet (50 mg total) by mouth every 6 (six) hours as needed. 10/22/15   Gilda Creasehristopher J Cobi Delph, MD   BP 128/93 mmHg  Pulse 87  Temp(Src) 98.8 F (37.1 C) (Oral)  Resp 20  Ht 5\' 6"  (1.676 m)  Wt 143 lb (64.864 kg)  BMI 23.09 kg/m2  SpO2 96%  LMP 10/12/2015 Physical Exam  Constitutional: She is oriented to person, place, and time. She appears well-developed and well-nourished. No distress.  HENT:  Head: Normocephalic and atraumatic.  Right Ear: Hearing normal.  Left Ear: Hearing normal.  Nose: Nose normal.  Mouth/Throat: Oropharynx is clear and moist and mucous membranes are normal.  Eyes: Conjunctivae and EOM are normal. Pupils are equal, round, and reactive to light.  Neck: Normal range of motion. Neck supple.  Cardiovascular: Regular rhythm, S1 normal and S2 normal.  Exam reveals no gallop and no friction rub.   No murmur heard. Pulmonary/Chest: Effort normal and breath sounds normal. No respiratory distress. She exhibits no tenderness.  Abdominal: Soft. Normal appearance and bowel sounds are normal. There is no hepatosplenomegaly. There is no tenderness. There is no rebound, no guarding, no tenderness at McBurney's point and negative Murphy's sign. No hernia.  Genitourinary:  Cervix closed with a small amount  of bleeding an no clots No CMT or discharge.   Musculoskeletal: Normal range of motion.  Neurological: She is alert and oriented to person, place, and time. She has normal strength. No cranial nerve deficit or sensory deficit. Coordination normal. GCS eye subscore is 4. GCS verbal subscore is 5. GCS motor subscore is 6.  Skin: Skin is warm, dry and intact. No rash noted. No cyanosis.  Psychiatric: She has a normal mood and affect. Her speech is normal and  behavior is normal. Thought content normal.  Nursing note and vitals reviewed.   ED Course  Procedures (including critical care time) DIAGNOSTIC STUDIES: Oxygen Saturation is 96% on RA,  normal by my interpretation.    COORDINATION OF CARE: 1:55 AM Discussed treatment plan which includes lab work and pelvic exam with pt at bedside and pt agreed to plan.  Labs Review Labs Reviewed  WET PREP, GENITAL - Abnormal; Notable for the following:    WBC, Wet Prep HPF POC RARE (*)    All other components within normal limits  COMPREHENSIVE METABOLIC PANEL - Abnormal; Notable for the following:    Potassium 3.4 (*)    Calcium 8.6 (*)    Total Bilirubin 0.2 (*)    All other components within normal limits  CBC - Abnormal; Notable for the following:    WBC 12.6 (*)    All other components within normal limits  URINALYSIS, ROUTINE W REFLEX MICROSCOPIC (NOT AT [facility]) - Abnormal; Notable for the following:    Hgb urine dipstick LARGE (*)    All other components within normal limits  URINE MICROSCOPIC-ADD ON - Abnormal; Notable for the following:    Squamous Epithelial / LPF 0-5 (*)    Bacteria, UA FEW (*)    All other components within normal limits  POC URINE PREG, ED  GC/CHLAMYDIA PROBE AMP ([place]) NOT AT [facility]    Imaging Review No results found. I have personally reviewed and evaluated these lab results as part of my medical decision-making.   EKG Interpretation None      MDM   Final diagnoses:  Abnormal uterine bleeding    Presents to the inspiratory for evaluation of heavy vaginal bleeding. Patient reports that she has had irregularity since she stopped Depakote shots one year ago. Patient is having heavy bleeding with passage of clots and pelvic pain and cramping. Bleeding started only a short time after her most recent last menstrual period stopped. Patient has normal vital signs. Hemoglobin is normal. Pelvic exam did not reveal any acute abnormality. She will be treated  with Provera and follow-up with OB/GYN.  I personally performed the services described in this documentation, which was scribed in my presence. The recorded information has been reviewed and is accurate.    Gilda Crease, MD 10/22/15 [phone]

## 2016-03-17 ENCOUNTER — Encounter (HOSPITAL_COMMUNITY): Payer: Self-pay | Admitting: Emergency Medicine

## 2016-03-17 ENCOUNTER — Emergency Department (HOSPITAL_COMMUNITY)
Admission: EM | Admit: 2016-03-17 | Discharge: 2016-03-17 | Disposition: A | Discharge status: Home / Self Care | Payer: Self-pay | Attending: Emergency Medicine | Admitting: Emergency Medicine

## 2016-03-17 DIAGNOSIS — Z79899 Other long term (current) drug therapy: Secondary | ICD-10-CM | POA: Insufficient documentation

## 2016-03-17 DIAGNOSIS — F17210 Nicotine dependence, cigarettes, uncomplicated: Secondary | ICD-10-CM | POA: Insufficient documentation

## 2016-03-17 DIAGNOSIS — K047 Periapical abscess without sinus: Secondary | ICD-10-CM | POA: Insufficient documentation

## 2016-03-17 DIAGNOSIS — K0889 Other specified disorders of teeth and supporting structures: Secondary | ICD-10-CM

## 2016-03-17 DIAGNOSIS — J45909 Unspecified asthma, uncomplicated: Secondary | ICD-10-CM | POA: Insufficient documentation

## 2016-03-17 MED ORDER — HYDROCODONE-ACETAMINOPHEN 5-325 MG PO TABS: 1.0000 | ORAL_TABLET | Freq: Four times a day (QID) | ORAL | 0 refills | Status: DC | PRN

## 2016-03-17 MED ORDER — CLINDAMYCIN HCL 150 MG PO CAPS
300.0000 mg | ORAL_CAPSULE | Freq: Three times a day (TID) | ORAL | 0 refills | Status: DC
Start: 2016-03-17 — End: 2023-06-11

## 2016-03-17 MED ORDER — ONDANSETRON 4 MG PO TBDP
4.0000 mg | ORAL_TABLET | Freq: Once | ORAL | Status: AC
  Administered 2016-03-17: 4 mg via ORAL
  Filled 2016-03-17: qty 1

## 2016-03-17 MED ORDER — OXYCODONE-ACETAMINOPHEN 5-325 MG PO TABS
2.0000 | ORAL_TABLET | Freq: Once | ORAL | Status: AC
  Administered 2016-03-17: 2 via ORAL
  Filled 2016-03-17: qty 2

## 2016-03-17 NOTE — ED Provider Notes (Signed)
[facility]-EMERGENCY DEPT Provider Note   CSN: [id number] Arrival date & time: 03/17/16  1412     History   Chief Complaint Chief Complaint  Patient presents with  . Dental Pain    HPI Brittney Henderson is a 30 y.o. female who  Presents emergency Department with chief complaint of right lower dental pain. Patient states that she has to missing fillings in her first and second molar on the right lower side. She's had intermittent pain. However, over the past 2 days her pain has become severe. It is worse when chewing on that side, it is worse when any air, heat or cold hurts it. She describes the pain as 10 out of 10, and excruciating. She denies any difficulty breathing or swallowing. She has not been running any fevers.  HPI  Past Medical History:  Diagnosis Date  . Asthma     There are no active problems to display for this patient.   History reviewed. No pertinent surgical history.  OB History    No data available       Home Medications    Prior to Admission medications   Medication Sig Start Date End Date Taking? Authorizing Provider  ibuprofen (ADVIL,MOTRIN) 800 MG tablet Take 1 tablet (800 mg total) by mouth 3 (three) times daily. 10/22/15   Gilda Creasehristopher J Pollina, MD  medroxyPROGESTERone (PROVERA) 10 MG tablet Take 1 tablet (10 mg total) by mouth daily. 10/22/15   Gilda Creasehristopher J Pollina, MD  naphazoline-glycerin (CLEAR EYES) 0.012-0.2 % SOLN Place 1-2 drops into the right eye daily as needed. For redness    Historical Provider, MD  naproxen sodium (ANAPROX) 220 MG tablet Take 880 mg by mouth once.    Historical Provider, MD  traMADol (ULTRAM) 50 MG tablet Take 1 tablet (50 mg total) by mouth every 6 (six) hours as needed. 10/22/15   Gilda Creasehristopher J Pollina, MD    Family History History reviewed. No pertinent family history.  Social History Social History  Substance Use Topics  . Smoking status: Current Every Day Smoker    Packs/day: 0.25    Types: Cigarettes  .  Smokeless tobacco: Not on file  . Alcohol use Yes     Comment: 1-2 times a week.      Allergies   Penicillins   Review of Systems Review of Systems  Constitutional: Negative for fever.  HENT: Positive for dental problem. Negative for trouble swallowing and voice change.   Respiratory: Negative for stridor.    42. \ Physical Exam Updated Vital Signs BP 140/96 (BP Location: Left Arm)   Pulse 106   Temp 99 F (37.2 C) (Oral)   Resp 18   Ht 5\' 6"  (1.676 m)   Wt 65.8 kg   SpO2 98%   BMI 23.40 kg/m   Physical Exam  Constitutional: She is oriented to person, place, and time. She appears well-developed and well-nourished. No distress.  HENT:  Head: Normocephalic and atraumatic.  Mouth/Throat: Oropharynx is clear and moist.    Open cavities in both the first and second molars in the right lower side, exquisitely tender to palpation, mild erythema surrounding the teeth in the gums. No evidence of abscess.  Eyes: Conjunctivae are normal. No scleral icterus.  Neck: Normal range of motion.  Cardiovascular: Normal rate, regular rhythm and normal heart sounds.  Exam reveals no gallop and no friction rub.   No murmur heard. Pulmonary/Chest: Effort normal and breath sounds normal. No stridor. No respiratory distress.  Abdominal: Soft. Bowel  sounds are normal. She exhibits no distension and no mass. There is no tenderness. There is no guarding.  Neurological: She is alert and oriented to person, place, and time.  Skin: Skin is warm and dry. She is not diaphoretic.     ED Treatments / Results  Labs (all labs ordered are listed, but only abnormal results are displayed) Labs Reviewed - No data to display  EKG  EKG Interpretation None       Radiology No results found.  Procedures Procedures (including critical care time)  Medications Ordered in ED Medications  oxyCODONE-acetaminophen (PERCOCET/ROXICET) 5-325 MG per tablet 2 tablet (not administered)  ondansetron  (ZOFRAN-ODT) disintegrating tablet 4 mg (not administered)     Initial Impression / Assessment and Plan / ED Course  I have reviewed the triage vital signs and the nursing notes.  Pertinent labs & imaging results that were available during my care of the patient were reviewed by me and considered in my medical decision making (see chart for details).  Clinical Course    Patient with toothache.  No gross abscess.  Exam unconcerning for Ludwig's angina or spread of infection.  Will treat with penicillin and pain medicine.  Urged patient to follow-up with dentist.     Final Clinical Impressions(s) / ED Diagnoses   Final diagnoses:  None    New Prescriptions New Prescriptions   No medications on file     Arthor Captain, PA-C 03/20/16 1128    Benjiman Core, MD 03/20/16 1704

## 2016-03-17 NOTE — ED Triage Notes (Signed)
Pt sts right sided lower dental pain x 2 days

## 2016-03-17 NOTE — Discharge Instructions (Signed)

## 2016-03-18 ENCOUNTER — Emergency Department (HOSPITAL_COMMUNITY): Admission: EM | Admit: 2016-03-18 | Discharge: 2016-03-18 | Discharge status: Against Medical Advice | Payer: Self-pay

## 2016-03-18 NOTE — ED Notes (Signed)
Pt states that she is going to go buy some ibuprofen and take that and see how she feels; pt was seen yesterday and is on antibiotics and pain medications; pt left prior to triage

## 2016-07-28 ENCOUNTER — Encounter (HOSPITAL_COMMUNITY): Payer: Self-pay | Admitting: *Deleted

## 2016-07-28 ENCOUNTER — Emergency Department (HOSPITAL_COMMUNITY)
Admission: EM | Admit: 2016-07-28 | Discharge: 2016-07-28 | Disposition: A | Discharge status: Home / Self Care | Payer: Self-pay | Attending: Emergency Medicine | Admitting: Emergency Medicine

## 2016-07-28 ENCOUNTER — Emergency Department (HOSPITAL_COMMUNITY): Payer: Self-pay

## 2016-07-28 DIAGNOSIS — J069 Acute upper respiratory infection, unspecified: Secondary | ICD-10-CM | POA: Insufficient documentation

## 2016-07-28 DIAGNOSIS — Z8709 Personal history of other diseases of the respiratory system: Secondary | ICD-10-CM

## 2016-07-28 DIAGNOSIS — Z79899 Other long term (current) drug therapy: Secondary | ICD-10-CM | POA: Insufficient documentation

## 2016-07-28 DIAGNOSIS — H6500 Acute serous otitis media, unspecified ear: Secondary | ICD-10-CM

## 2016-07-28 DIAGNOSIS — F17210 Nicotine dependence, cigarettes, uncomplicated: Secondary | ICD-10-CM | POA: Insufficient documentation

## 2016-07-28 DIAGNOSIS — H6122 Impacted cerumen, left ear: Secondary | ICD-10-CM | POA: Insufficient documentation

## 2016-07-28 DIAGNOSIS — H6502 Acute serous otitis media, left ear: Secondary | ICD-10-CM | POA: Insufficient documentation

## 2016-07-28 DIAGNOSIS — J45909 Unspecified asthma, uncomplicated: Secondary | ICD-10-CM | POA: Insufficient documentation

## 2016-07-28 MED ORDER — ALBUTEROL SULFATE (2.5 MG/3ML) 0.083% IN NEBU
5.0000 mg | INHALATION_SOLUTION | Freq: Once | RESPIRATORY_TRACT | Status: AC
  Administered 2016-07-28: 5 mg via RESPIRATORY_TRACT
  Filled 2016-07-28: qty 6

## 2016-07-28 MED ORDER — AZITHROMYCIN 250 MG PO TABS: ORAL_TABLET | ORAL | 0 refills | Status: DC

## 2016-07-28 MED ORDER — ALBUTEROL SULFATE HFA 108 (90 BASE) MCG/ACT IN AERS
2.0000 | INHALATION_SPRAY | RESPIRATORY_TRACT | Status: DC | PRN
  Administered 2016-07-28: 2 via RESPIRATORY_TRACT
  Filled 2016-07-28: qty 6.7

## 2016-07-28 MED ORDER — ACETAMINOPHEN 500 MG PO TABS
1000.0000 mg | ORAL_TABLET | Freq: Once | ORAL | Status: AC
  Administered 2016-07-28: 1000 mg via ORAL
  Filled 2016-07-28: qty 2

## 2016-07-28 MED ORDER — ALBUTEROL SULFATE HFA 108 (90 BASE) MCG/ACT IN AERS: 2.0000 | INHALATION_SPRAY | RESPIRATORY_TRACT | 2 refills | Status: AC | PRN

## 2016-07-28 NOTE — Discharge Instructions (Signed)
It was our pleasure to provide your ER care today - we hope that you feel better. ° °Rest. Drink plenty of fluids.  ° °Use albuterol inhaler every 4 hours as need.  ° °Avoid any smoking.  ° °Take tylenol/advil as need.  ° °You may try over the counter cold/flu medication as need for symptom relief.  ° °For ear wax, see instruction sheet, and try ear wax removal kit ([facility]).  ° °Follow up with primary care doctor in 1 week if symptoms fail to improve/resolve. ° °Return to ER right away if worse, difficulty breathing, or other emergency.  °

## 2016-07-28 NOTE — ED Triage Notes (Signed)
Pt c/o L ear fullness since yesterday. Also reports asthma since Saturday.Pt does not have an inhaler

## 2016-07-28 NOTE — ED Provider Notes (Signed)
[facility]-EMERG[facility]Y DEPT Provider Note   CSN: [id number] Arrival date & time: 07/28/16  1848     History   Chief Complaint Chief Complaint  Patient presents with  . Ear Fullness  . Asthma    HPI Brittney Henderson is a 31 y.o. female.  Patient c/o productive cough in the past couple days. Yellowish sputum on occasion.  Denies chest pain. Also feels her asthma is acting up as feels wheezy. Currently denies sob. No leg pain or swelling. Denies sore throat. +mild nasal congestion. No severe headaches or body aches. w asthma, denies any recent exacerbation, denies admission, denies recent steroid use. +smoker. No known ill contacts. Also c/o left ear fullness. No hearing loss. No trauma to ear. Out of mdi at home.    The history is provided by the patient.  Ear Fullness  Pertinent negatives include no chest pain, no abdominal pain and no headaches.  Asthma  Pertinent negatives include no chest pain, no abdominal pain and no headaches.    Past Medical History:  Diagnosis Date  . Asthma     There are no active problems to display for this patient.   History reviewed. No pertinent surgical history.  OB History    No data available       Home Medications    Prior to Admission medications   Medication Sig Start Date End Date Taking? Authorizing Provider  clindamycin (CLEOCIN) 150 MG capsule Take 2 capsules (300 mg total) by mouth 3 (three) times daily. 03/17/16   Arthor Captain, PA-C  HYDROcodone-acetaminophen (NORCO) 5-325 MG tablet Take 1-2 tablets by mouth every 6 (six) hours as needed for moderate pain. 03/17/16   Arthor Captain, PA-C  ibuprofen (ADVIL,MOTRIN) 800 MG tablet Take 1 tablet (800 mg total) by mouth 3 (three) times daily. 10/22/15   Gilda Crease, MD  medroxyPROGESTERone (PROVERA) 10 MG tablet Take 1 tablet (10 mg total) by mouth daily. 10/22/15   Gilda Crease, MD  naphazoline-glycerin (CLEAR EYES) 0.012-0.2 % SOLN Place 1-2 drops into the right  eye daily as needed. For redness    Historical Provider, MD  naproxen sodium (ANAPROX) 220 MG tablet Take 880 mg by mouth once.    Historical Provider, MD  traMADol (ULTRAM) 50 MG tablet Take 1 tablet (50 mg total) by mouth every 6 (six) hours as needed. 10/22/15   Gilda Crease, MD    Family History No family history on file.  Social History Social History  Substance Use Topics  . Smoking status: Current Every Day Smoker    Packs/day: 0.25    Types: Cigarettes  . Smokeless tobacco: Never Used  . Alcohol use Yes     Comment: 1-2 times a week.      Allergies   Penicillins   Review of Systems Review of Systems  Constitutional: Negative for fever.  HENT: Positive for congestion.   Eyes: Negative for redness.  Respiratory: Positive for cough and wheezing.   Cardiovascular: Negative for chest pain and leg swelling.  Gastrointestinal: Negative for abdominal pain, diarrhea and vomiting.  Genitourinary: Negative for dysuria and flank pain.  Musculoskeletal: Negative for back pain and neck pain.  Skin: Negative for rash.  Neurological: Negative for headaches.  Hematological: Does not bruise/bleed easily.  Psychiatric/Behavioral: Negative for confusion.     Physical Exam Updated Vital Signs BP 132/96 (BP Location: Right Arm)   Pulse 78   Temp 97.7 F (36.5 C) (Oral)   Resp 18   Ht 5\' 6"  (  1.676 m)   Wt 67.1 kg   LMP 07/09/2016   SpO2 100%   BMI 23.89 kg/m   Physical Exam  Constitutional: She appears well-developed and well-nourished. No distress.  HENT:  Mouth/Throat: Oropharynx is clear and moist.  Mild nasal congestion. Right tm normal. Left ear w cerumen.  No eac swelling or tenderness.   Eyes: Conjunctivae are normal. No scleral icterus.  Neck: Neck supple. No tracheal deviation present.  Cardiovascular: Normal rate, regular rhythm, normal heart sounds and intact distal pulses.   Pulmonary/Chest: Effort normal. No respiratory distress.  V mild wheeze.  Good air exchange. No increased wob.   Abdominal: Soft. Normal appearance. She exhibits no distension. There is no tenderness.  Genitourinary:  Genitourinary Comments: No cva tenderness  Musculoskeletal: She exhibits no edema.  Lymphadenopathy:    She has no cervical adenopathy.  Neurological: She is alert.  Skin: Skin is warm and dry. No rash noted. She is not diaphoretic.  Psychiatric: She has a normal mood and affect.  Nursing note and vitals reviewed.    ED Treatments / Results  Labs (all labs ordered are listed, but only abnormal results are displayed) Labs Reviewed - No data to display  EKG  EKG Interpretation None       Radiology Dg Chest 2 View  Result Date: 07/28/2016 CLINICAL DATA:  Pt c/o central chest pain, SOB, and productive cough x 3 days. Hx of asthma. Pt is a current smoker. EXAM: CHEST  2 VIEW COMPARISON:  Chest x-ray dated 02/11/2011. FINDINGS: Heart size and mediastinal contours are normal. Lungs are clear. No pleural effusion or pneumothorax seen. Osseous structures are unremarkable. IMPRESSION: No active cardiopulmonary disease. No evidence of pneumonia or pulmonary edema. Electronically Signed   By: Bary RichardStan  Maynard M.D.   On: 07/28/2016 20:40    Procedures Procedures (including critical care time)  Medications Ordered in ED Medications - No data to display   Initial Impression / Assessment and Plan / ED Course  I have reviewed the triage vital signs and the nursing notes.  Pertinent labs & imaging results that were available during my care of the patient were reviewed by me and considered in my medical decision making (see chart for details).    cxr.   Albuterol neb.   Pt breathing comfortably. No distress.   Suspect viral uri.  Return precautions provided.   Albuterol mdi for home.   Final Clinical Impressions(s) / ED Diagnoses   Final diagnoses:  None    New Prescriptions New Prescriptions   No medications on file     Cathren LaineKevin  Nysa Sarin, MD 07/28/16 2046

## 2019-05-13 ENCOUNTER — Emergency Department
Admission: EM | Admit: 2019-05-13 | Discharge: 2019-05-13 | Disposition: A | Discharge status: Home / Self Care | Payer: Self-pay | Attending: Emergency Medicine | Admitting: Emergency Medicine

## 2019-05-13 ENCOUNTER — Emergency Department: Payer: Self-pay

## 2019-05-13 ENCOUNTER — Other Ambulatory Visit: Payer: Self-pay

## 2019-05-13 ENCOUNTER — Encounter: Payer: Self-pay | Admitting: Intensive Care

## 2019-05-13 DIAGNOSIS — J45909 Unspecified asthma, uncomplicated: Secondary | ICD-10-CM | POA: Insufficient documentation

## 2019-05-13 DIAGNOSIS — S83421A Sprain of lateral collateral ligament of right knee, initial encounter: Secondary | ICD-10-CM | POA: Insufficient documentation

## 2019-05-13 DIAGNOSIS — Y999 Unspecified external cause status: Secondary | ICD-10-CM | POA: Insufficient documentation

## 2019-05-13 DIAGNOSIS — Y939 Activity, unspecified: Secondary | ICD-10-CM | POA: Insufficient documentation

## 2019-05-13 DIAGNOSIS — Y929 Unspecified place or not applicable: Secondary | ICD-10-CM | POA: Insufficient documentation

## 2019-05-13 DIAGNOSIS — X501XXA Overexertion from prolonged static or awkward postures, initial encounter: Secondary | ICD-10-CM | POA: Insufficient documentation

## 2019-05-13 DIAGNOSIS — Z79899 Other long term (current) drug therapy: Secondary | ICD-10-CM | POA: Insufficient documentation

## 2019-05-13 DIAGNOSIS — F17210 Nicotine dependence, cigarettes, uncomplicated: Secondary | ICD-10-CM | POA: Insufficient documentation

## 2019-05-13 MED ORDER — MELOXICAM 15 MG PO TABS: 15.0000 mg | ORAL_TABLET | Freq: Every day | ORAL | 2 refills | Status: AC

## 2019-05-13 NOTE — ED Provider Notes (Signed)
[facility] Emergency Department Provider Note  ____________________________________________  [facility] Approximately 4:48 PM  I have reviewed the triage vital signs and the nursing notes.   HISTORY  Chief Complaint Leg Pain (right)    HPI Brittney Henderson is a 33 y.o. female who presents the emergency department complaining of right knee pain.  Patient reports that last night she was attempting to cross her legs to sit "[place] style" when she felt and heard a pop to the lateral aspect of the right knee.  Patient reports that she has had pain along the lateral collateral ligament distribution since this occurrence.  Patient states that she is able to bear weight but doing so increases the pain.  Patient reports no history of previous knee injuries or traumas.  Patient has not tried any medication prior to arrival.  She denies any other complaints at this time.         Past Medical History:  Diagnosis Date  . Asthma     There are no problems to display for this patient.   History reviewed. No pertinent surgical history.  Prior to Admission medications   Medication Sig Start Date End Date Taking? Authorizing Provider  albuterol (PROVENTIL HFA;VENTOLIN HFA) 108 (90 Base) MCG/ACT inhaler Inhale 2 puffs into the lungs every 4 (four) hours as needed for wheezing or shortness of breath. 07/28/16   Cathren LaineSteinl, Kevin, MD  azithromycin (ZITHROMAX Z-PAK) 250 MG tablet Take 2 tablets now and then one tablet PO daily for infection 07/28/16   Janne NapoleonNeese, Hope M, NP  clindamycin (CLEOCIN) 150 MG capsule Take 2 capsules (300 mg total) by mouth 3 (three) times daily. 03/17/16   Arthor CaptainHarris, Abigail, PA-C  HYDROcodone-acetaminophen (NORCO) 5-325 MG tablet Take 1-2 tablets by mouth every 6 (six) hours as needed for moderate pain. 03/17/16   Harris, Cammy CopaAbigail, PA-C  ibuprofen (ADVIL,MOTRIN) 800 MG tablet Take 1 tablet (800 mg total) by mouth 3 (three) times daily. 10/22/15   Gilda CreasePollina,  Christopher J, MD  medroxyPROGESTERone (PROVERA) 10 MG tablet Take 1 tablet (10 mg total) by mouth daily. 10/22/15   Gilda CreasePollina, Christopher J, MD  meloxicam (MOBIC) 15 MG tablet Take 1 tablet (15 mg total) by mouth daily. 05/13/19 05/12/20  Fisher, Roselyn BeringSusan W, PA-C  naphazoline-glycerin (CLEAR EYES) 0.012-0.2 % SOLN Place 1-2 drops into the right eye daily as needed. For redness    [provider]  naproxen sodium (ANAPROX) 220 MG tablet Take 880 mg by mouth once.    [provider]  traMADol (ULTRAM) 50 MG tablet Take 1 tablet (50 mg total) by mouth every 6 (six) hours as needed. 10/22/15   Gilda CreasePollina, Christopher J, MD    Allergies Penicillins  History reviewed. No pertinent family history.  Social History Social History   Tobacco Use  . Smoking status: Current Every Day Smoker    Packs/day: 0.25    Types: Cigarettes  . Smokeless tobacco: Never Used  Substance Use Topics  . Alcohol use: Yes    Comment: 1-2 times a week.   . Drug use: No     Review of Systems  Constitutional: No fever/chills Eyes: No visual changes. No discharge ENT: No upper respiratory complaints. Cardiovascular: no chest pain. Respiratory: no cough. No SOB. Gastrointestinal: No abdominal pain.  No nausea, no vomiting.   Musculoskeletal: Positive for lateral right knee pain/injury Skin: Negative for rash, abrasions, lacerations, ecchymosis. Neurological: Negative for headaches, focal weakness or numbness. 10-point ROS otherwise negative.  ____________________________________________   PHYSICAL  EXAM:  VITAL SIGNS: ED Triage Vitals  Enc Vitals Group     BP 05/13/19 1606 123/80     Pulse Rate 05/13/19 1606 90     Resp 05/13/19 1606 16     Temp 05/13/19 1606 98.6 F (37 C)     Temp Source 05/13/19 1606 Oral     SpO2 05/13/19 1606 99 %     Weight 05/13/19 1607 153 lb (69.4 kg)     Height 05/13/19 1607 5\' 6"  (1.676 m)     Head Circumference --      Peak Flow --      Pain Score 05/13/19  1607 10     Pain Loc --      Pain Edu? --      Excl. in GC? --      Constitutional: Alert and oriented. Well appearing and in no acute distress. Eyes: Conjunctivae are normal. PERRL. EOMI. Head: Atraumatic. ENT:      Ears:       Nose: No congestion/rhinnorhea.      Mouth/Throat: Mucous membranes are moist.  Neck: No stridor.    Cardiovascular: Normal rate, regular rhythm. Normal S1 and S2.  Good peripheral circulation. Respiratory: Normal respiratory effort without tachypnea or retractions. Lungs CTAB. Good air entry to the bases with no decreased or absent breath sounds. Musculoskeletal: Full range of motion to all extremities. No gross deformities appreciated.  Visualization of the right knee reveals no ecchymosis, edema.  Patient has limited range of motion due to pain.  Patient is nontender to palpation along the anterior, medial or posterior aspects of the knee.  Patient is very tender palpation along the LCL distribution.  No palpable abnormality or deficit in this region.  Valgus, Lachman's, McMurray's is negative.  Varus test induces significant pain, however no gross laxity is appreciated.  Examination of the hip and ankle is unremarkable.  Dorsalis pedis pulse intact distally.  Sensation intact distally. Neurologic:  Normal speech and language. No gross focal neurologic deficits are appreciated.  Skin:  Skin is warm, dry and intact. No rash noted. Psychiatric: Mood and affect are normal. Speech and behavior are normal. Patient exhibits appropriate insight and judgement.   ____________________________________________   LABS (all labs ordered are listed, but only abnormal results are displayed)  Labs Reviewed - No data to display ____________________________________________  EKG   ____________________________________________  RADIOLOGY I personally viewed and evaluated these images as part of my medical decision making, as well as reviewing the written report by the  radiologist.  DG Knee Complete 4 Views Right  Result Date: 05/13/2019 CLINICAL DATA:  Patient c/o right knee pain after she reports playing around with her kids and hearing it "pop." EXAM: RIGHT KNEE - COMPLETE 4+ VIEW COMPARISON:  None. FINDINGS: No evidence of fracture, dislocation, or joint effusion. No evidence of arthropathy or other focal bone abnormality. Soft tissues are unremarkable. IMPRESSION: Negative. Electronically Signed   By: 14/04/2019 M.D.   On: 05/13/2019 18:24    ____________________________________________    PROCEDURES  Procedure(s) performed:    Procedures    Medications - No data to display   ____________________________________________   INITIAL IMPRESSION / ASSESSMENT AND PLAN / ED COURSE  Pertinent labs & imaging results that were available during my care of the patient were reviewed by me and considered in my medical decision making (see chart for details).  Review of the [place] CSRS was performed in accordance of the [facility] prior to dispensing any controlled drugs.  Patient's diagnosis is consistent with Sprain of LCL. Patient presented with R lateral knee pain. Imaging reveals no acute findings. Most consistent with LCL strain. Knee immobolizer, mobic at home. Follow-up with orthopedics..  Patient is given ED precautions to return to the ED for any worsening or new symptoms.     ____________________________________________  FINAL CLINICAL IMPRESSION(S) / ED DIAGNOSES  Final diagnoses:  Sprain of lateral collateral ligament of right knee, initial encounter      NEW MEDICATIONS STARTED DURING THIS VISIT:  ED Discharge Orders         Ordered    meloxicam (MOBIC) 15 MG tablet  Daily     05/13/19 1839              This chart was dictated using voice recognition software/Dragon. Despite best efforts to proofread, errors can occur which can change the meaning. Any change was purely unintentional.    Darletta Moll, PA-C 05/13/19 1854    Nena Polio, MD 05/13/19 [phone]

## 2019-05-13 NOTE — ED Triage Notes (Signed)
Patient c/o right leg pain after she reports playing around with her kids and hearing it "pop." able to ambulate with a limp

## 2019-05-13 NOTE — ED Notes (Signed)
See triage note  Presents with right leg pain  States she heard a pop while playing with children  Ambulates with sl limp

## 2019-05-13 NOTE — Discharge Instructions (Signed)
Follow-up with Dr. Patel/[facility] orthopedics if not improving in 1 week.  Wear the knee immobilizer for 2 weeks.  If you continue to have pain in the knee at that time you will need to be seen by orthopedics for a possible MRI.  At this time we consider your knee sprained.  If it continues to hurt after 10 days then you may have to consider that you tore your meniscus.

## 2020-05-29 IMAGING — DX DG KNEE COMPLETE 4+V*R*
4 series · 4 of 4 positions shown · non-contrast
Comparison: None.

CLINICAL DATA: Patient c/o right knee pain after she reports
playing around with her kids and hearing it "pop."

EXAM:
RIGHT KNEE - COMPLETE 4+ VIEW

[knee ap]
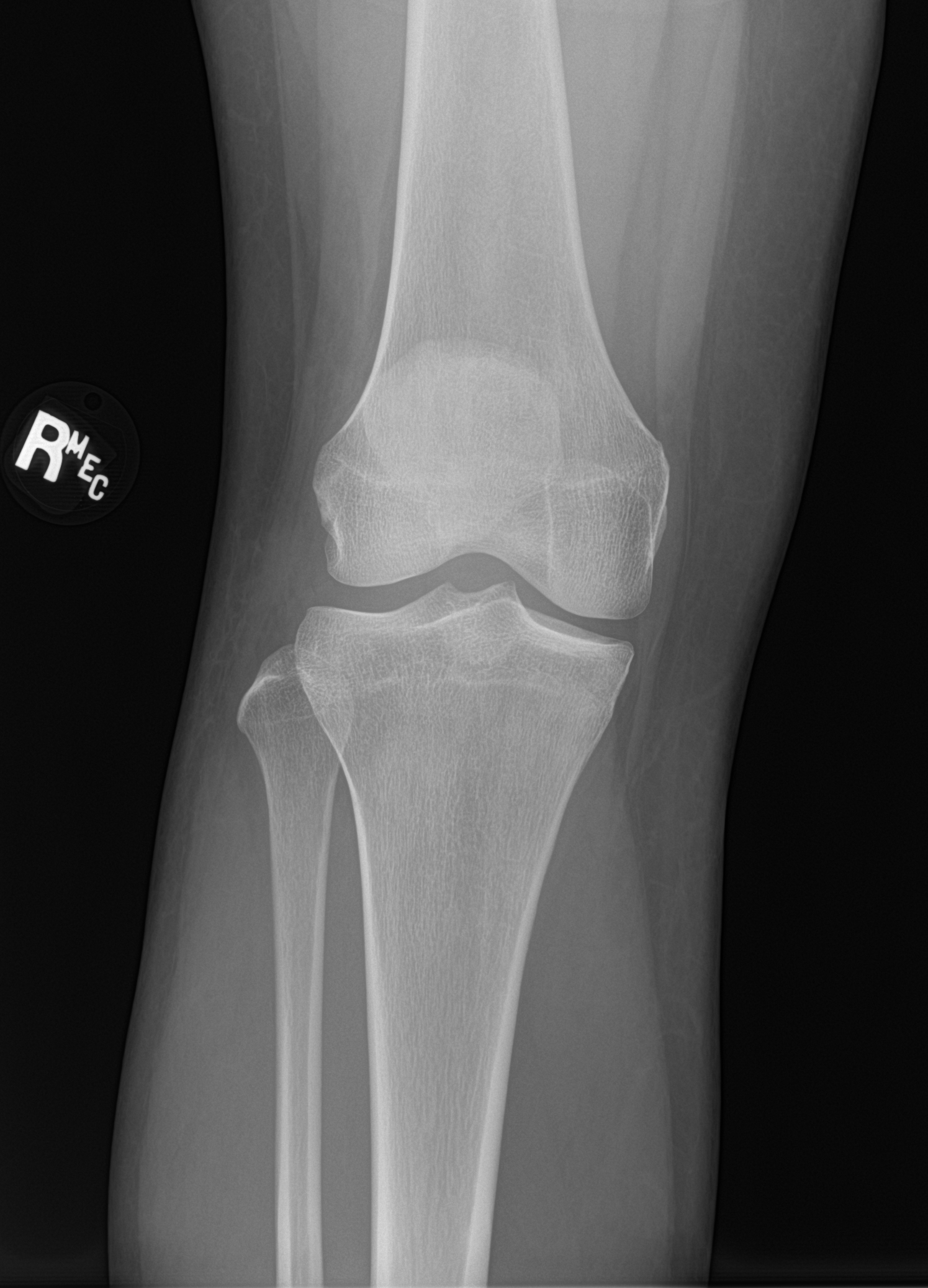

[knee lat]
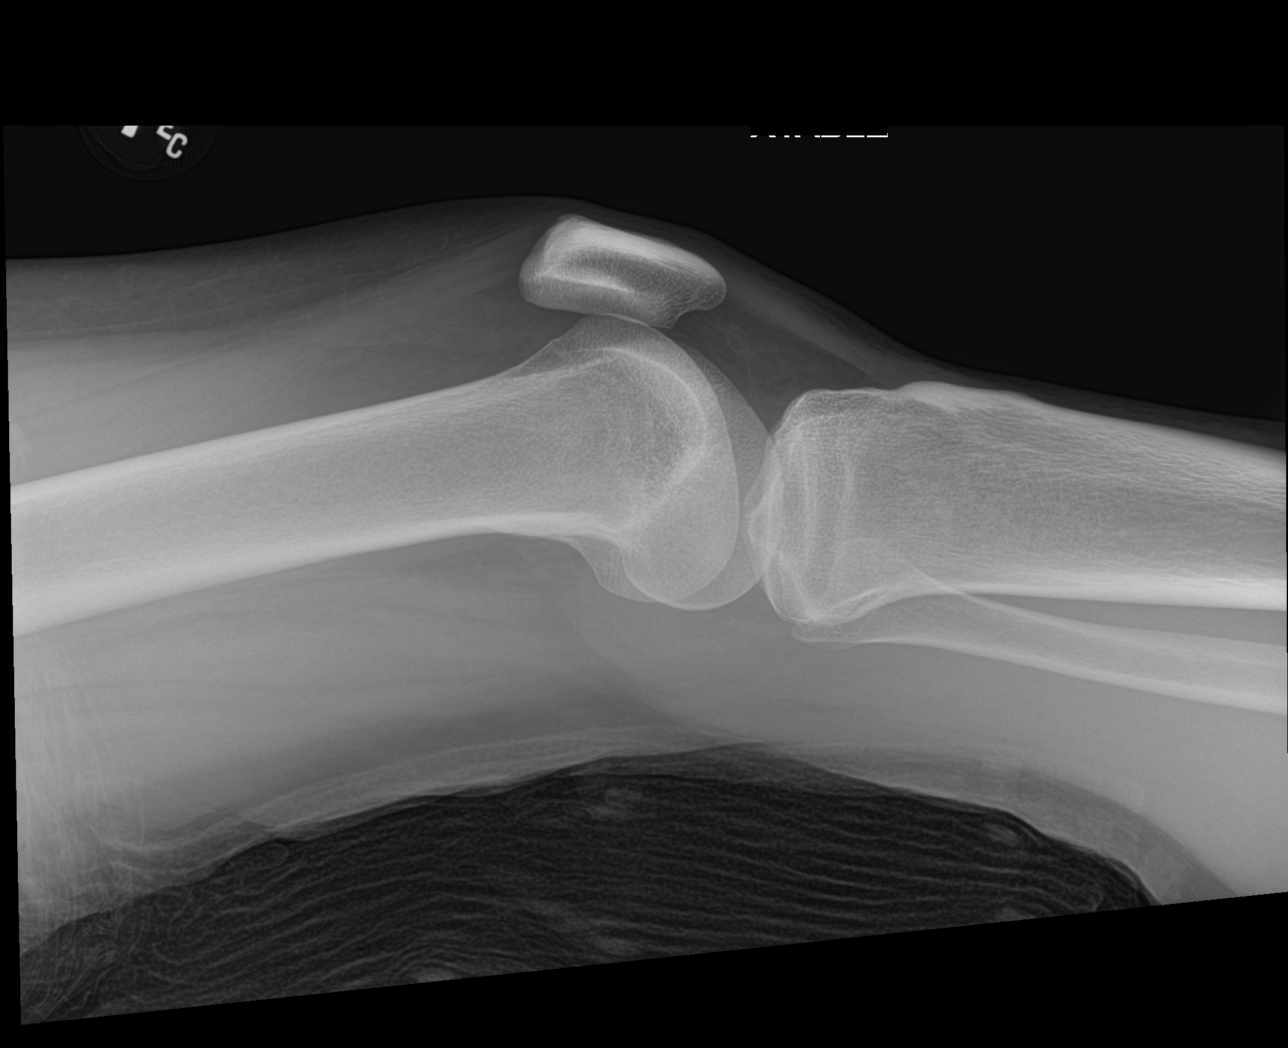

[knee obl (1 of 2)]
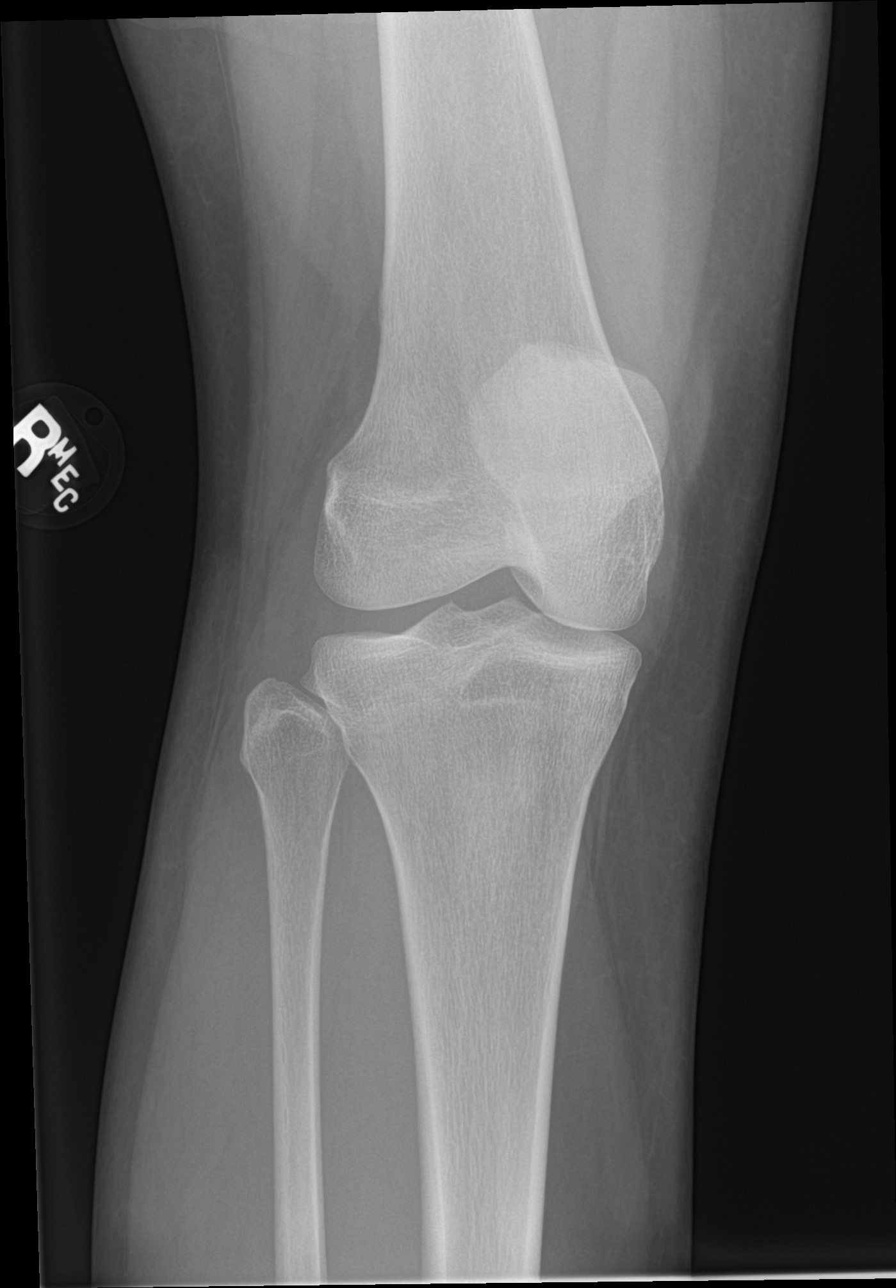

[knee obl (2 of 2)]
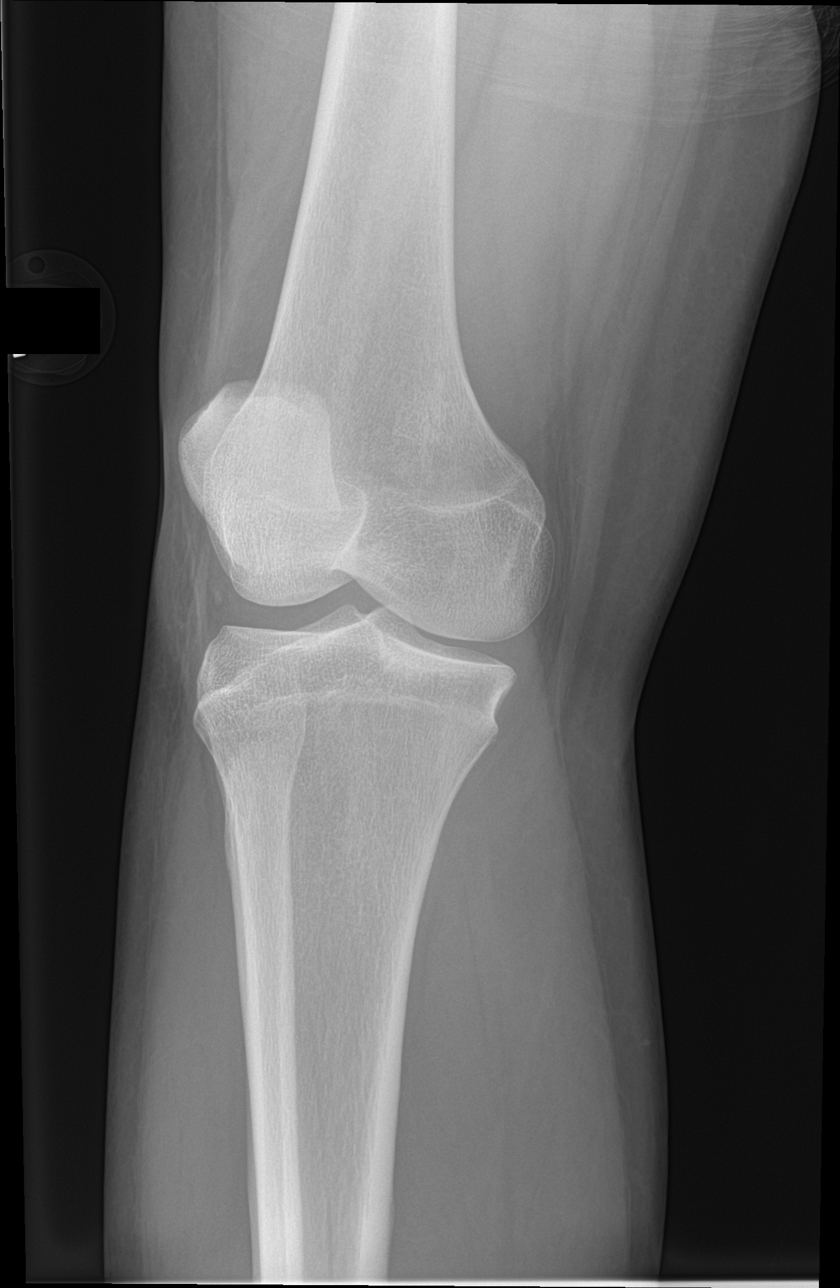

[4 of 4 positions shown; findings below may reference images not displayed]

FINDINGS: No evidence of fracture, dislocation, or joint effusion. No evidence
of arthropathy or other focal bone abnormality. Soft tissues are
unremarkable.
IMPRESSION: Negative.

## 2021-02-14 ENCOUNTER — Encounter: Payer: Self-pay | Admitting: Emergency Medicine

## 2021-02-14 ENCOUNTER — Other Ambulatory Visit: Payer: Self-pay

## 2021-02-14 DIAGNOSIS — J45909 Unspecified asthma, uncomplicated: Secondary | ICD-10-CM | POA: Insufficient documentation

## 2021-02-14 DIAGNOSIS — R112 Nausea with vomiting, unspecified: Secondary | ICD-10-CM | POA: Insufficient documentation

## 2021-02-14 DIAGNOSIS — R197 Diarrhea, unspecified: Secondary | ICD-10-CM | POA: Insufficient documentation

## 2021-02-14 DIAGNOSIS — F17210 Nicotine dependence, cigarettes, uncomplicated: Secondary | ICD-10-CM | POA: Insufficient documentation

## 2021-02-14 DIAGNOSIS — A46 Erysipelas: Secondary | ICD-10-CM | POA: Insufficient documentation

## 2021-02-14 DIAGNOSIS — E876 Hypokalemia: Secondary | ICD-10-CM | POA: Insufficient documentation

## 2021-02-14 LAB — CBC
HCT: 40.2 % (ref 36.0–46.0)
Hemoglobin: 13.2 g/dL (ref 12.0–15.0)
MCH: 30.1 pg (ref 26.0–34.0)
MCHC: 32.8 g/dL (ref 30.0–36.0)
MCV: 91.8 fL (ref 80.0–100.0)
Platelets: 503 10*3/uL — ABNORMAL HIGH (ref 150–400)
RBC: 4.38 MIL/uL (ref 3.87–5.11)
RDW: 14.3 % (ref 11.5–15.5)
WBC: 7 10*3/uL (ref 4.0–10.5)
nRBC: 0 % (ref 0.0–0.2)

## 2021-02-14 LAB — COMPREHENSIVE METABOLIC PANEL
ALT: 17 U/L (ref 0–44)
AST: 28 U/L (ref 15–41)
Albumin: 3.9 g/dL (ref 3.5–5.0)
Alkaline Phosphatase: 68 U/L (ref 38–126)
Anion gap: 9 (ref 5–15)
BUN: 7 mg/dL (ref 6–20)
CO2: 22 mmol/L (ref 22–32)
Calcium: 8.5 mg/dL — ABNORMAL LOW (ref 8.9–10.3)
Chloride: 102 mmol/L (ref 98–111)
Creatinine, Ser: 0.7 mg/dL (ref 0.44–1.00)
GFR, Estimated: >60 mL/min (ref >60)
Glucose, Bld: 133 mg/dL — ABNORMAL HIGH (ref 70–99)
Potassium: 3 mmol/L — ABNORMAL LOW (ref 3.5–5.1)
Sodium: 133 mmol/L — ABNORMAL LOW (ref 135–145)
Total Bilirubin: 0.5 mg/dL (ref 0.3–1.2)
Total Protein: 7.4 g/dL (ref 6.5–8.1)

## 2021-02-14 LAB — LIPASE, BLOOD: Lipase: 27 U/L (ref 11–51)

## 2021-02-14 NOTE — ED Triage Notes (Signed)
Pt reports that this am she noticed redness, warmth and swelling in her left upper arm, she went to work and developed N/V/D. Work sent her home, she fell asleep and was not feeling any better came here to be evaluated.

## 2021-02-15 ENCOUNTER — Emergency Department
Admission: EM | Admit: 2021-02-15 | Discharge: 2021-02-15 | Disposition: A | Discharge status: Home / Self Care | Payer: Self-pay | Attending: Emergency Medicine | Admitting: Emergency Medicine

## 2021-02-15 DIAGNOSIS — R112 Nausea with vomiting, unspecified: Secondary | ICD-10-CM

## 2021-02-15 DIAGNOSIS — E876 Hypokalemia: Secondary | ICD-10-CM

## 2021-02-15 DIAGNOSIS — A46 Erysipelas: Secondary | ICD-10-CM

## 2021-02-15 LAB — URINALYSIS, COMPLETE (UACMP) WITH MICROSCOPIC
Bacteria, UA: NONE SEEN
Bilirubin Urine: NEGATIVE
Glucose, UA: NEGATIVE mg/dL
Ketones, ur: NEGATIVE mg/dL
Nitrite: NEGATIVE
Protein, ur: NEGATIVE mg/dL
Specific Gravity, Urine: 1.023 (ref 1.005–1.030)
pH: 5 (ref 5.0–8.0)

## 2021-02-15 LAB — POC URINE PREG, ED: Preg Test, Ur: NEGATIVE

## 2021-02-15 MED ORDER — POTASSIUM CHLORIDE CRYS ER 20 MEQ PO TBCR: 20.0000 meq | EXTENDED_RELEASE_TABLET | Freq: Two times a day (BID) | ORAL | 0 refills | Status: DC

## 2021-02-15 MED ORDER — POTASSIUM CHLORIDE CRYS ER 20 MEQ PO TBCR
40.0000 meq | EXTENDED_RELEASE_TABLET | Freq: Once | ORAL | Status: AC
  Administered 2021-02-15: 40 meq via ORAL
  Filled 2021-02-15: qty 2

## 2021-02-15 MED ORDER — POTASSIUM CHLORIDE CRYS ER 20 MEQ PO TBCR: 20.0000 meq | EXTENDED_RELEASE_TABLET | Freq: Two times a day (BID) | ORAL | 0 refills | Status: AC

## 2021-02-15 MED ORDER — CEPHALEXIN 500 MG PO CAPS
500.0000 mg | ORAL_CAPSULE | Freq: Once | ORAL | Status: AC
  Administered 2021-02-15: 500 mg via ORAL
  Filled 2021-02-15: qty 1

## 2021-02-15 MED ORDER — CEPHALEXIN 500 MG PO CAPS: 500.0000 mg | ORAL_CAPSULE | Freq: Four times a day (QID) | ORAL | 0 refills | Status: AC

## 2021-02-15 MED ORDER — CEPHALEXIN 500 MG PO CAPS: 500.0000 mg | ORAL_CAPSULE | Freq: Four times a day (QID) | ORAL | 0 refills | Status: DC

## 2021-02-15 MED ORDER — ONDANSETRON 4 MG PO TBDP: 4.0000 mg | ORAL_TABLET | Freq: Three times a day (TID) | ORAL | 0 refills | Status: DC | PRN

## 2021-02-15 MED ORDER — ONDANSETRON 4 MG PO TBDP: 4.0000 mg | ORAL_TABLET | Freq: Three times a day (TID) | ORAL | 0 refills | Status: AC | PRN

## 2021-02-15 MED ORDER — ONDANSETRON 4 MG PO TBDP
4.0000 mg | ORAL_TABLET | Freq: Once | ORAL | Status: AC
  Administered 2021-02-15: 4 mg via ORAL
  Filled 2021-02-15: qty 1

## 2021-02-15 NOTE — ED Provider Notes (Signed)
[facility]  ____________________________________________   Event Date/Time   First MD Initiated Contact with Patient 02/15/21 0109     (approximate)  I have reviewed the triage vital signs and the nursing notes.   HISTORY  Chief Complaint Nausea, Emesis, and Diarrhea    HPI Brittney Henderson is a 35 y.o. female with past medical history of asthma who presents with left arm redness as well as nausea and vomiting.  Patient was in her normal state of health when she woke up today and noticed that the left upper arm was red and swollen.  She is not sure if she was bit by something.  She denies fevers or chills but has just felt generally unwell today.  Has had several episodes of emesis and nausea as well as fatigue.  She denies abdominal pain, diarrhea constipation.  She denies dysuria.  She denies any other areas of rash or wounds.         Past Medical History:  Diagnosis Date   Asthma     There are no problems to display for this patient.   No past surgical history on file.  Prior to Admission medications   Medication Sig Start Date End Date Taking? Authorizing Provider  albuterol (PROVENTIL HFA;VENTOLIN HFA) 108 (90 Base) MCG/ACT inhaler Inhale 2 puffs into the lungs every 4 (four) hours as needed for wheezing or shortness of breath. 07/28/16   Cathren Laine, MD  azithromycin (ZITHROMAX Z-PAK) 250 MG tablet Take 2 tablets now and then one tablet PO daily for infection 07/28/16   Janne Napoleon, NP  clindamycin (CLEOCIN) 150 MG capsule Take 2 capsules (300 mg total) by mouth 3 (three) times daily. 03/17/16   Arthor Captain, PA-C  HYDROcodone-acetaminophen (NORCO) 5-325 MG tablet Take 1-2 tablets by mouth every 6 (six) hours as needed for moderate pain. 03/17/16   Harris, Cammy Copa, PA-C  ibuprofen (ADVIL,MOTRIN) 800 MG tablet Take 1 tablet (800 mg total) by mouth 3 (three) times daily. 10/22/15   Gilda Crease, MD  medroxyPROGESTERone (PROVERA)  10 MG tablet Take 1 tablet (10 mg total) by mouth daily. 10/22/15   Gilda Crease, MD  naphazoline-glycerin (CLEAR EYES) 0.012-0.2 % SOLN Place 1-2 drops into the right eye daily as needed. For redness    [provider]  naproxen sodium (ANAPROX) 220 MG tablet Take 880 mg by mouth once.    [provider]  traMADol (ULTRAM) 50 MG tablet Take 1 tablet (50 mg total) by mouth every 6 (six) hours as needed. 10/22/15   Gilda Crease, MD    Allergies Penicillins  No family history on file.  Social History Social History   Tobacco Use   Smoking status: Every Day    Packs/day: 0.25    Types: Cigarettes   Smokeless tobacco: Never  Substance Use Topics   Alcohol use: Yes    Comment: 1-2 times a week.    Drug use: No    Review of Systems   Review of Systems  Constitutional:  Positive for fatigue. Negative for chills and fever.  HENT:  Negative for congestion.   Respiratory:  Negative for cough.   Gastrointestinal:  Positive for nausea and vomiting. Negative for abdominal pain, constipation and diarrhea.  Musculoskeletal:  Negative for joint swelling and myalgias.  Skin:  Positive for rash.   Physical Exam Updated Vital Signs BP 123/84 (BP Location: Right Arm)   Pulse 95   Temp 98.6 F (37 C) (Oral)  Resp 18   Ht 5\' 6"  (1.676 m)   Wt 69.4 kg   SpO2 99%   BMI 24.69 kg/m   Physical Exam Vitals and nursing note reviewed.  Constitutional:      General: She is not in acute distress.    Appearance: Normal appearance.  HENT:     Head: Normocephalic and atraumatic.  Eyes:     General: No scleral icterus.    Conjunctiva/sclera: Conjunctivae normal.  Pulmonary:     Effort: Pulmonary effort is normal. No respiratory distress.     Breath sounds: No stridor.  Abdominal:     General: There is no distension.     Palpations: Abdomen is soft.     Tenderness: There is no abdominal tenderness. There is no guarding.  Musculoskeletal:         General: No deformity or signs of injury.     Cervical back: Normal range of motion.  Skin:    General: Skin is dry.     Coloration: Skin is not jaundiced or pale.     Comments: Well-circumscribed area of erythema on the medial upper left arm with some, there is warmth, no crepitus, no lymphatic streaking  Neurological:     General: No focal deficit present.     Mental Status: She is alert and oriented to person, place, and time. Mental status is at baseline.  Psychiatric:        Mood and Affect: Mood normal.        Behavior: Behavior normal.     LABS (all labs ordered are listed, but only abnormal results are displayed)  Labs Reviewed  COMPREHENSIVE METABOLIC PANEL - Abnormal; Notable for the following components:      Result Value   Sodium 133 (*)    Potassium 3.0 (*)    Glucose, Bld 133 (*)    Calcium 8.5 (*)    All other components within normal limits  CBC - Abnormal; Notable for the following components:   Platelets 503 (*)    All other components within normal limits  LIPASE, BLOOD  URINALYSIS, COMPLETE (UACMP) WITH MICROSCOPIC  POC URINE PREG, ED   ____________________________________________  EKG  N/a ____________________________________________  RADIOLOGY , personally viewed and evaluated these images (plain radiographs) as part of my medical decision making, as well as reviewing the written report by the radiologist.  ED MD interpretation:  n/a    ____________________________________________   PROCEDURES  Procedure(s) performed (including Critical Care):  Procedures   ____________________________________________   INITIAL IMPRESSION / ASSESSMENT AND PLAN / ED COURSE     35 year old female presents both with a area of erythema and redness in the left upper arm as well as nausea and vomiting.  On exam her arm clinically looks like erysipelas and that it is well demarcated area of erythema and warmth.  There is no streaking.   She is afebrile so I do think this can be treated as an outpatient.  In terms of her nausea and vomiting this could potentially be related to the infection.  Her abdomen is benign, hCG negative and she is tolerating p.o.  Her labs are notable for normal white count, potassium is a bit low at 3 will replete.  She is tolerating p.o. currently.  Will make sure she is not pregnant and treat with Zofran, Keflex and potassium supplementation.  Clinical Course as of 02/15/21 0228  Fri Feb 15, 2021  0227 Preg Test, Ur: NEGATIVE [KM]    Clinical  Course User Index [KM] Georga Hacking, MD     ____________________________________________   FINAL CLINICAL IMPRESSION(S) / ED DIAGNOSES  Final diagnoses:  Erysipelas  Hypokalemia  Non-intractable vomiting with nausea, unspecified vomiting type     ED Discharge Orders     None        Note:  This document was prepared using Dragon voice recognition software and may include unintentional dictation errors.    Georga Hacking, MD 02/15/21 [phone]

## 2021-02-15 NOTE — Discharge Instructions (Addendum)
You likely have an infection on your upper arm.  Please take the antibiotic for the next 5 days.  If you have worsening redness or fevers, please return to the emergency department.  Your potassium was on the low side today.  Please take the potassium supplement for the next 3 days and follow-up with your primary care physician to have repeat check.  You can take the Zofran as needed for nausea.

## 2022-06-02 ENCOUNTER — Encounter (HOSPITAL_COMMUNITY): Payer: Self-pay | Admitting: Pharmacy Technician

## 2022-06-02 ENCOUNTER — Other Ambulatory Visit: Payer: Self-pay

## 2022-06-02 ENCOUNTER — Emergency Department (HOSPITAL_COMMUNITY): Payer: Self-pay

## 2022-06-02 ENCOUNTER — Emergency Department (HOSPITAL_COMMUNITY)
Admission: EM | Admit: 2022-06-02 | Discharge: 2022-06-02 | Disposition: A | Discharge status: Home / Self Care | Payer: Self-pay | Attending: Emergency Medicine | Admitting: Emergency Medicine

## 2022-06-02 DIAGNOSIS — Z1152 Encounter for screening for COVID-19: Secondary | ICD-10-CM | POA: Insufficient documentation

## 2022-06-02 DIAGNOSIS — J4541 Moderate persistent asthma with (acute) exacerbation: Secondary | ICD-10-CM | POA: Insufficient documentation

## 2022-06-02 LAB — RESP PANEL BY RT-PCR (RSV, FLU A&B, COVID)  RVPGX2
Influenza A by PCR: NEGATIVE
Influenza B by PCR: NEGATIVE
Resp Syncytial Virus by PCR: NEGATIVE
SARS Coronavirus 2 by RT PCR: NEGATIVE

## 2022-06-02 MED ORDER — PREDNISONE 10 MG PO TABS: 20.0000 mg | ORAL_TABLET | Freq: Every day | ORAL | 0 refills | Status: DC

## 2022-06-02 MED ORDER — ALBUTEROL SULFATE (2.5 MG/3ML) 0.083% IN NEBU: 2.5000 mg | INHALATION_SOLUTION | Freq: Four times a day (QID) | RESPIRATORY_TRACT | 12 refills | Status: AC | PRN

## 2022-06-02 MED ORDER — ALBUTEROL SULFATE HFA 108 (90 BASE) MCG/ACT IN AERS
2.0000 | INHALATION_SPRAY | RESPIRATORY_TRACT | Status: DC | PRN
  Administered 2022-06-02: 2 via RESPIRATORY_TRACT
  Filled 2022-06-02: qty 6.7

## 2022-06-02 MED ORDER — PREDNISONE 20 MG PO TABS
60.0000 mg | ORAL_TABLET | Freq: Once | ORAL | Status: AC
  Administered 2022-06-02: 60 mg via ORAL
  Filled 2022-06-02: qty 3

## 2022-06-02 MED ORDER — IPRATROPIUM-ALBUTEROL 0.5-2.5 (3) MG/3ML IN SOLN
3.0000 mL | Freq: Once | RESPIRATORY_TRACT | Status: AC
  Administered 2022-06-02: 3 mL via RESPIRATORY_TRACT
  Filled 2022-06-02: qty 3

## 2022-06-02 NOTE — Discharge Instructions (Signed)
Take prednisone as prescribed, hydrate, take Tylenol 1000 mg every 6 hours for body aches, use fluticasone sinus spray for congestion 2 sprays in each nostril twice daily, Zyrtec once daily.  Follow-up with your primary care doctor.  Return to emergency room for any new or concerning symptoms.

## 2022-06-02 NOTE — ED Provider Notes (Signed)
[facility] EMERGENCY DEPARTMENT Provider Note   CSN: [id number] Arrival date & time: 06/02/22  1817     History  Chief Complaint  Patient presents with   Shortness of Breath    Brittney Henderson is a 37 y.o. female.   Shortness of Breath Patient is a 37 year old female with past medical history coming in for asthma present emergency room today with 5 days of cough some congestion and fatigue shortness of breath.  No hemoptysis no leg swelling no history of VTE.  She denies any chest pain she does endorse some tightness in her chest when she takes deep breaths.  No nausea vomiting abdominal pain lightheadedness or dizziness.  No other associate symptoms.  She says she had a fever last week which resolved.     Home Medications Prior to Admission medications   Medication Sig Start Date End Date Taking? Authorizing Provider  albuterol (PROVENTIL) (2.5 MG/3ML) 0.083% nebulizer solution Take 3 mLs (2.5 mg total) by nebulization every 6 (six) hours as needed for wheezing or shortness of breath. 06/02/22  Yes Teofilo Lupinacci S, PA  predniSONE (DELTASONE) 10 MG tablet Take 2 tablets (20 mg total) by mouth daily. 06/02/22  Yes Tatiyana Foucher, Rodrigo Ran, PA  albuterol (PROVENTIL HFA;VENTOLIN HFA) 108 (90 Base) MCG/ACT inhaler Inhale 2 puffs into the lungs every 4 (four) hours as needed for wheezing or shortness of breath. 07/28/16   Cathren Laine, MD  azithromycin (ZITHROMAX Z-PAK) 250 MG tablet Take 2 tablets now and then one tablet PO daily for infection 07/28/16   Janne Napoleon, NP  clindamycin (CLEOCIN) 150 MG capsule Take 2 capsules (300 mg total) by mouth 3 (three) times daily. 03/17/16   Arthor Captain, PA-C  HYDROcodone-acetaminophen (NORCO) 5-325 MG tablet Take 1-2 tablets by mouth every 6 (six) hours as needed for moderate pain. 03/17/16   Harris, Cammy Copa, PA-C  ibuprofen (ADVIL,MOTRIN) 800 MG tablet Take 1 tablet (800 mg total) by mouth 3 (three) times daily. 10/22/15   Gilda Crease, MD  medroxyPROGESTERone (PROVERA) 10 MG tablet Take 1 tablet (10 mg total) by mouth daily. 10/22/15   Gilda Crease, MD  naphazoline-glycerin (CLEAR EYES) 0.012-0.2 % SOLN Place 1-2 drops into the right eye daily as needed. For redness    [provider]  naproxen sodium (ANAPROX) 220 MG tablet Take 880 mg by mouth once.    [provider]  potassium chloride SA (KLOR-CON) 20 MEQ tablet Take 1 tablet (20 mEq total) by mouth 2 (two) times daily for 3 days. 02/15/21 02/18/21  Georga Hacking, MD  traMADol (ULTRAM) 50 MG tablet Take 1 tablet (50 mg total) by mouth every 6 (six) hours as needed. 10/22/15   Gilda Crease, MD      Allergies    Penicillins    Review of Systems   Review of Systems  Respiratory:  Positive for shortness of breath.     Physical Exam Updated Vital Signs BP 123/89   Pulse 79   Temp 98 F (36.7 C) (Oral)   Resp 18   SpO2 100%  Physical Exam Vitals and nursing note reviewed.  Constitutional:      General: She is not in acute distress. HENT:     Head: Normocephalic and atraumatic.     Nose: Nose normal.  Eyes:     General: No scleral icterus. Cardiovascular:     Rate and Rhythm: Normal rate and regular rhythm.     Pulses: Normal pulses.  Heart sounds: Normal heart sounds.  Pulmonary:     Effort: Pulmonary effort is normal. No respiratory distress.     Breath sounds: Wheezing present.  Abdominal:     Palpations: Abdomen is soft.     Tenderness: There is no abdominal tenderness.  Musculoskeletal:     Cervical back: Normal range of motion.     Right lower leg: No edema.     Left lower leg: No edema.  Skin:    General: Skin is warm and dry.     Capillary Refill: Capillary refill takes less than 2 seconds.  Neurological:     Mental Status: She is alert. Mental status is at baseline.  Psychiatric:        Mood and Affect: Mood normal.        Behavior: Behavior normal.     ED Results / Procedures  / Treatments   Labs (all labs ordered are listed, but only abnormal results are displayed) Labs Reviewed  RESP PANEL BY RT-PCR (RSV, FLU A&B, COVID)  [id number]    EKG None  Radiology DG Chest 2 View  Result Date: 06/02/2022 CLINICAL DATA:  Cough and shortness of breath EXAM: CHEST - 2 VIEW COMPARISON:  Chest two views 07/28/2016 FINDINGS: Cardiac silhouette and mediastinal contours are within normal limits. The lungs are clear. No pleural effusion or pneumothorax. No acute skeletal abnormality. IMPRESSION: No active cardiopulmonary disease. Electronically Signed   By: Yvonne Kendall M.D.   On: 06/02/2022 19:03    Procedures Procedures    Medications Ordered in ED Medications  albuterol (VENTOLIN HFA) 108 (90 Base) MCG/ACT inhaler 2 puff (has no administration in time range)  ipratropium-albuterol (DUONEB) 0.5-2.5 (3) MG/3ML nebulizer solution 3 mL (3 mLs Nebulization Given 06/02/22 2149)  predniSONE (DELTASONE) tablet 60 mg (60 mg Oral Given 06/02/22 2149)    ED Course/ Medical Decision Making/ A&P                           Medical Decision Making Amount and/or Complexity of Data Reviewed Radiology: ordered.  Risk Prescription drug management.   This patient presents to the ED for concern of wheezing, shortness of breath, this involves a number of treatment options, and is a complaint that carries with it a moderate risk of complications and morbidity. A differential diagnosis was considered for the patient's symptoms which is discussed below:   Asthma, COPD, viral illness, also consider the below differential for any emergency presentation w SOB  The causes for shortness of breath include but are not limited to Cardiac (AHF, pericardial effusion and tamponade, arrhythmias, ischemia, etc) Respiratory (COPD, asthma, pneumonia, pneumothorax, primary pulmonary hypertension, PE/VQ mismatch) Hematological (anemia) Neuromuscular (ALS, Guillain-Barr, etc)    Co  morbidities: Discussed in HPI   Brief History:  Patient is a 37 year old female with past medical history coming in for asthma present emergency room today with 5 days of cough some congestion and fatigue shortness of breath.  No hemoptysis no leg swelling no history of VTE.  She denies any chest pain she does endorse some tightness in her chest when she takes deep breaths.  No nausea vomiting abdominal pain lightheadedness or dizziness.  No other associate symptoms.  She says she had a fever last week which resolved.    EMR reviewed including pt PMHx, past surgical history and past visits to ER.   See HPI for more details   Lab Tests:  COVID flu RSV negative by PCR  Imaging Studies:  NAD. I personally reviewed all imaging studies and no acute abnormality found. I agree with radiology interpretation.    Cardiac Monitoring:  NA NA   Medicines ordered:  I ordered medication including DuoNeb, prednisone for shortness of breath Reevaluation of the patient after these medicines showed that the patient improved I have reviewed the patients home medicines and have made adjustments as needed   Critical Interventions:     Consults/Attending Physician      Reevaluation:  After the interventions noted above I re-evaluated patient and found that they have :resolved Shortness of breath much improved patient is no longer wheezing on my exam, no tachypnea, no hypoxia, she is on room air, tolerating p.o. and ambulatory.  Social Determinants of Health:  The patient's social determinants of health were a factor in the care of this patient    Problem List / ED Course:  Asthma exacerbation likely from viral source given congestion and sinus symptoms.  Tolerating p.o. ambulatory and not hypoxic -- wheezing now gone after 1 DuoNeb and no tachypnea no hypoxia.  Doubt other emergent cause of patient's shortness of breath.  She will need to follow-up with PCP.  Return  precautions were discussed.   Dispostion:  After consideration of the diagnostic results and the patients response to treatment, I feel that the patent would benefit from outpatient follow-up, return precautions, prednisone    Final Clinical Impression(s) / ED Diagnoses Final diagnoses:  Moderate persistent asthma with acute exacerbation    Rx / DC Orders ED Discharge Orders          Ordered    predniSONE (DELTASONE) 10 MG tablet  Daily        06/02/22 2157    albuterol (PROVENTIL) (2.5 MG/3ML) 0.083% nebulizer solution  Every 6 hours PRN        06/02/22 2157              Tedd Sias, [place] 06/02/22 2218    Audley Hose, MD 06/02/22 2230

## 2022-06-02 NOTE — ED Provider Triage Note (Signed)
Emergency Medicine Provider Triage Evaluation Note  Brittney Henderson , a 37 y.o. female  was evaluated in triage.  Pt complains of cough and congestion. Symptoms x 4 days.  No known sick contacts.  No relief with OTC meds.  Review of Systems  Positive:  Negative:   Physical Exam  BP (!) 157/89   Pulse 98   Temp 98.4 F (36.9 C) (Oral)   Resp 16   SpO2 98%  Gen:   Awake, no distress   Resp:  Normal effort  MSK:   Moves extremities without difficulty  Other:    Medical Decision Making  Medically screening exam initiated at 6:32 PM.  Appropriate orders placed.  Brittney Henderson was informed that the remainder of the evaluation will be completed by another provider, this initial triage assessment does not replace that evaluation, and the importance of remaining in the ED until their evaluation is complete.     Bud Face, PA-C 06/02/22 [phone]

## 2022-06-02 NOTE — ED Triage Notes (Signed)
Pt here with reports of cough, congestion, shob, fever and chills for the last 4 days.

## 2022-06-02 NOTE — ED Notes (Signed)
Patient verbalizes understanding of discharge instructions. Opportunity for questioning and answers were provided. Armband removed by staff, pt discharged from ED. Pt ambulatory to ED waiting room with steady gait.  

## 2022-10-14 ENCOUNTER — Ambulatory Visit: Payer: Self-pay

## 2022-10-14 ENCOUNTER — Emergency Department (HOSPITAL_COMMUNITY): Payer: Self-pay

## 2022-10-14 ENCOUNTER — Emergency Department (HOSPITAL_COMMUNITY)
Admission: EM | Admit: 2022-10-14 | Discharge: 2022-10-14 | Disposition: A | Discharge status: Home / Self Care | Payer: Self-pay | Attending: Emergency Medicine | Admitting: Emergency Medicine

## 2022-10-14 ENCOUNTER — Encounter (HOSPITAL_COMMUNITY): Payer: Self-pay

## 2022-10-14 ENCOUNTER — Emergency Department (HOSPITAL_BASED_OUTPATIENT_CLINIC_OR_DEPARTMENT_OTHER): Payer: Self-pay

## 2022-10-14 DIAGNOSIS — L03115 Cellulitis of right lower limb: Secondary | ICD-10-CM | POA: Insufficient documentation

## 2022-10-14 DIAGNOSIS — Z23 Encounter for immunization: Secondary | ICD-10-CM | POA: Insufficient documentation

## 2022-10-14 DIAGNOSIS — J45909 Unspecified asthma, uncomplicated: Secondary | ICD-10-CM | POA: Insufficient documentation

## 2022-10-14 DIAGNOSIS — M7989 Other specified soft tissue disorders: Secondary | ICD-10-CM

## 2022-10-14 LAB — LACTIC ACID, PLASMA: Lactic Acid, Venous: 0.9 mmol/L (ref 0.5–1.9)

## 2022-10-14 LAB — BASIC METABOLIC PANEL
Anion gap: 8 (ref 5–15)
BUN: 6 mg/dL (ref 6–20)
CO2: 23 mmol/L (ref 22–32)
Calcium: 8.5 mg/dL — ABNORMAL LOW (ref 8.9–10.3)
Chloride: 104 mmol/L (ref 98–111)
Creatinine, Ser: 0.65 mg/dL (ref 0.44–1.00)
GFR, Estimated: >60 mL/min (ref >60)
Glucose, Bld: 122 mg/dL — ABNORMAL HIGH (ref 70–99)
Potassium: 3.5 mmol/L (ref 3.5–5.1)
Sodium: 135 mmol/L (ref 135–145)

## 2022-10-14 LAB — PROTIME-INR
INR: 0.9 (ref 0.8–1.2)
Prothrombin Time: 12.7 seconds (ref 11.4–15.2)

## 2022-10-14 LAB — CBC WITH DIFFERENTIAL/PLATELET
Abs Immature Granulocytes: 0.05 10*3/uL (ref 0.00–0.07)
Basophils Absolute: 0 10*3/uL (ref 0.0–0.1)
Basophils Relative: 0 %
Eosinophils Absolute: 0.3 10*3/uL (ref 0.0–0.5)
Eosinophils Relative: 2 %
HCT: 36.5 % (ref 36.0–46.0)
Hemoglobin: 11.6 g/dL — ABNORMAL LOW (ref 12.0–15.0)
Immature Granulocytes: 0 %
Lymphocytes Relative: 18 %
Lymphs Abs: 2.8 10*3/uL (ref 0.7–4.0)
MCH: 29 pg (ref 26.0–34.0)
MCHC: 31.8 g/dL (ref 30.0–36.0)
MCV: 91.3 fL (ref 80.0–100.0)
Monocytes Absolute: 0.7 10*3/uL (ref 0.1–1.0)
Monocytes Relative: 5 %
Neutro Abs: 11.6 10*3/uL — ABNORMAL HIGH (ref 1.7–7.7)
Neutrophils Relative %: 75 %
Platelets: 462 10*3/uL — ABNORMAL HIGH (ref 150–400)
RBC: 4 MIL/uL (ref 3.87–5.11)
RDW: 15.1 % (ref 11.5–15.5)
WBC: 15.4 10*3/uL — ABNORMAL HIGH (ref 4.0–10.5)
nRBC: 0 % (ref 0.0–0.2)

## 2022-10-14 LAB — APTT: aPTT: 26 seconds (ref 24–36)

## 2022-10-14 LAB — D-DIMER, QUANTITATIVE: D-Dimer, Quant: 1.25 ug/mL-FEU — ABNORMAL HIGH (ref 0.00–0.50)

## 2022-10-14 MED ORDER — DOXYCYCLINE HYCLATE 100 MG PO TABS
100.0000 mg | ORAL_TABLET | Freq: Once | ORAL | Status: AC
  Administered 2022-10-14: 100 mg via ORAL
  Filled 2022-10-14: qty 1

## 2022-10-14 MED ORDER — HYDROXYZINE HCL 25 MG PO TABS
25.0000 mg | ORAL_TABLET | Freq: Once | ORAL | Status: AC
  Administered 2022-10-14: 25 mg via ORAL
  Filled 2022-10-14: qty 1

## 2022-10-14 MED ORDER — HYDROCODONE-ACETAMINOPHEN 5-325 MG PO TABS
1.0000 | ORAL_TABLET | Freq: Once | ORAL | Status: AC
  Administered 2022-10-14: 1 via ORAL
  Filled 2022-10-14: qty 1

## 2022-10-14 MED ORDER — IBUPROFEN 800 MG PO TABS
800.0000 mg | ORAL_TABLET | Freq: Once | ORAL | Status: AC
  Administered 2022-10-14: 800 mg via ORAL
  Filled 2022-10-14: qty 1

## 2022-10-14 MED ORDER — LACTATED RINGERS IV BOLUS
1000.0000 mL | Freq: Once | INTRAVENOUS | Status: AC
  Administered 2022-10-14: 1000 mL via INTRAVENOUS

## 2022-10-14 MED ORDER — CEFADROXIL 500 MG PO CAPS: 500.0000 mg | ORAL_CAPSULE | Freq: Two times a day (BID) | ORAL | 0 refills | Status: AC

## 2022-10-14 MED ORDER — DOXYCYCLINE HYCLATE 100 MG PO CAPS: 100.0000 mg | ORAL_CAPSULE | Freq: Two times a day (BID) | ORAL | 0 refills | Status: AC

## 2022-10-14 MED ORDER — HYDROXYZINE HCL 25 MG PO TABS: 25.0000 mg | ORAL_TABLET | Freq: Four times a day (QID) | ORAL | 0 refills | Status: DC | PRN

## 2022-10-14 MED ORDER — SODIUM CHLORIDE 0.9 % IV SOLN
1.0000 g | Freq: Once | INTRAVENOUS | Status: AC
  Administered 2022-10-14: 1 g via INTRAVENOUS
  Filled 2022-10-14: qty 10

## 2022-10-14 MED ORDER — TETANUS-DIPHTH-ACELL PERTUSSIS 5-2.5-18.5 LF-MCG/0.5 IM SUSY
0.5000 mL | PREFILLED_SYRINGE | Freq: Once | INTRAMUSCULAR | Status: AC
  Administered 2022-10-14: 0.5 mL via INTRAMUSCULAR
  Filled 2022-10-14: qty 0.5

## 2022-10-14 NOTE — Discharge Instructions (Addendum)
You have been seen today in the Emergency Department (ED)  for cellulitis, an infection of the skin and soft tissues. Please take your antibiotics (doxycycline and cefadroxil) as prescribed for their ENTIRE prescribed duration and do not miss any doses.  Take Tylenol and Motrin as needed for pain, but only as written on the box.  Take the Atarax as needed for itching.  Please elevate your leg as much as possible, ideally above your heart.   Please follow up with your doctor within 24-48 hours for recheck of your infection.  Call your doctor sooner or return to the Emergency Department (ED) if you develop worsening signs of infection such as: increased redness, increased pain, leakage of pus, persistent fever > 100.4, numbness or weakness near the involved area or other symptoms that concern you.

## 2022-10-14 NOTE — ED Provider Notes (Signed)
[place] EMERGENCY DEPARTMENT AT [facility] Provider Note   CSN: [id number] Arrival date & time: 10/14/22  0107     History  Chief Complaint  Patient presents with   Ankle Pain    Brittney Henderson is a 37 y.o. female.  With PMH of asthma who presents with right ankle and leg pain with associated redness and swelling over the past two days.  She thinks she may have been bitten by ants but she does not know.  She denies any snake bite.  This started yesterday and she developed increased redness and swelling and pain today.  She has been taking allergy medicine and using hydrocortisone cream for the itching.  She has not taken anything for the pain.  She denies any history of DVT or PE.  She had no injury.  She has no weakness, no numbness or tingling loss of sensation.  No fevers.  No history of IV drug use.   Ankle Pain      Home Medications Prior to Admission medications   Medication Sig Start Date End Date Taking? Authorizing Provider  cefadroxil (DURICEF) 500 MG capsule Take 1 capsule (500 mg total) by mouth 2 (two) times daily for 7 days. 10/14/22 10/21/22 Yes Mardene Sayer, MD  doxycycline (VIBRAMYCIN) 100 MG capsule Take 1 capsule (100 mg total) by mouth 2 (two) times daily for 7 days. 10/14/22 10/21/22 Yes Mardene Sayer, MD  hydrOXYzine (ATARAX) 25 MG tablet Take 1 tablet (25 mg total) by mouth every 6 (six) hours as needed for itching. 10/14/22  Yes Mardene Sayer, MD  albuterol (PROVENTIL HFA;VENTOLIN HFA) 108 (90 Base) MCG/ACT inhaler Inhale 2 puffs into the lungs every 4 (four) hours as needed for wheezing or shortness of breath. 07/28/16   Cathren Laine, MD  albuterol (PROVENTIL) (2.5 MG/3ML) 0.083% nebulizer solution Take 3 mLs (2.5 mg total) by nebulization every 6 (six) hours as needed for wheezing or shortness of breath. 06/02/22   Gailen Shelter, PA  azithromycin (ZITHROMAX Z-PAK) 250 MG tablet Take 2 tablets now and then one tablet PO daily  for infection 07/28/16   Janne Napoleon, NP  clindamycin (CLEOCIN) 150 MG capsule Take 2 capsules (300 mg total) by mouth 3 (three) times daily. 03/17/16   Arthor Captain, PA-C  HYDROcodone-acetaminophen (NORCO) 5-325 MG tablet Take 1-2 tablets by mouth every 6 (six) hours as needed for moderate pain. 03/17/16   Harris, Cammy Copa, PA-C  ibuprofen (ADVIL,MOTRIN) 800 MG tablet Take 1 tablet (800 mg total) by mouth 3 (three) times daily. 10/22/15   Gilda Crease, MD  medroxyPROGESTERone (PROVERA) 10 MG tablet Take 1 tablet (10 mg total) by mouth daily. 10/22/15   Gilda Crease, MD  naphazoline-glycerin (CLEAR EYES) 0.012-0.2 % SOLN Place 1-2 drops into the right eye daily as needed. For redness    [provider]  naproxen sodium (ANAPROX) 220 MG tablet Take 880 mg by mouth once.    [provider]  potassium chloride SA (KLOR-CON) 20 MEQ tablet Take 1 tablet (20 mEq total) by mouth 2 (two) times daily for 3 days. 02/15/21 02/18/21  Georga Hacking, MD  predniSONE (DELTASONE) 10 MG tablet Take 2 tablets (20 mg total) by mouth daily. 06/02/22   Gailen Shelter, PA  traMADol (ULTRAM) 50 MG tablet Take 1 tablet (50 mg total) by mouth every 6 (six) hours as needed. 10/22/15   Gilda Crease, MD      Allergies  Penicillins    Review of Systems   Review of Systems  Physical Exam Updated Vital Signs BP (!) 125/94   Pulse 100   Temp 97.6 F (36.4 C) (Oral)   Resp 16   Ht 5\' 6"  (1.676 m)   Wt 64.9 kg   LMP 09/16/2022 (Approximate)   SpO2 99%   BMI 23.08 kg/m  Physical Exam Constitutional: Alert and oriented. Well appearing and in no distress. Eyes: Conjunctivae are normal. ENT      Head: Normocephalic and atraumatic. Cardiovascular: S1, S2, tachycardic, regular rhythm, equal palpable DP pulses bilaterally normal and symmetric distal pulses are present in all extremities.Warm and well perfused. Respiratory: Normal respiratory effort. Gastrointestinal:  Nondistended Musculoskeletal: Normal range of motion in all extremities.      Right lower leg: Blanching erythematous warm tender irregularly demarcated region surrounding the right anterior and lateral ankle and lower shin as can be seen in media.  Full range of motion of right ankle intact without severe tenderness.  Able to wiggle toes.  Sensation intact distally.  Streaking of erythema extending up anterior right shin as can be seen in media.  2 small pinpoint bite-like marks on right anterior ankle. 1 small pustule on right lateral malleolus.      Left lower leg: No tenderness or edema. Neurologic: Normal speech and language. No gross focal neurologic deficits are appreciated. Skin: Skin is warm, dry and intact. No rash noted. Psychiatric: Mood and affect are normal. Speech and behavior are normal.     ED Results / Procedures / Treatments   Labs (all labs ordered are listed, but only abnormal results are displayed) Labs Reviewed  BASIC METABOLIC PANEL - Abnormal; Notable for the following components:      Result Value   Glucose, Bld 122 (*)    Calcium 8.5 (*)    All other components within normal limits  CBC WITH DIFFERENTIAL/PLATELET - Abnormal; Notable for the following components:   WBC 15.4 (*)    Hemoglobin 11.6 (*)    Platelets 462 (*)    Neutro Abs 11.6 (*)    All other components within normal limits  D-DIMER, QUANTITATIVE - Abnormal; Notable for the following components:   D-Dimer, Quant 1.25 (*)    All other components within normal limits  CULTURE, BLOOD (ROUTINE X 2)  CULTURE, BLOOD (ROUTINE X 2)  PROTIME-INR  APTT  LACTIC ACID, PLASMA    EKG None  Radiology DG Ankle Complete Right  Result Date: 10/14/2022 CLINICAL DATA:  Pain, redness, swelling EXAM: RIGHT ANKLE - COMPLETE 3+ VIEW COMPARISON:  None Available. FINDINGS: Diffuse soft tissue swelling about the ankle. No acute bony abnormality. Specifically, no fracture, subluxation, or dislocation. Joint  spaces maintained. IMPRESSION: No acute bony abnormality. Electronically Signed   By: Charlett Nose M.D.   On: 10/14/2022 01:43    Procedures Procedures    Medications Ordered in ED Medications  HYDROcodone-acetaminophen (NORCO/VICODIN) 5-325 MG per tablet 1 tablet (1 tablet Oral Given 10/14/22 0320)  cefTRIAXone (ROCEPHIN) 1 g in sodium chloride 0.9 % 100 mL IVPB (0 g Intravenous Stopped 10/14/22 0447)  doxycycline (VIBRA-TABS) tablet 100 mg (100 mg Oral Given 10/14/22 0320)  Tdap (BOOSTRIX) injection 0.5 mL (0.5 mLs Intramuscular Given 10/14/22 0327)  hydrOXYzine (ATARAX) tablet 25 mg (25 mg Oral Given 10/14/22 0320)  lactated ringers bolus 1,000 mL (0 mLs Intravenous Stopped 10/14/22 0981)    ED Course/ Medical Decision Making/ A&P Clinical Course as of 10/14/22 0707  Tue Oct 14, 2022  4098 Patient's labs notable for leukocytosis 15.4 with left shift likely due to infection and suspected cellulitis.  Elevated platelet count 462 likely reactive from acute infection.  Normal coags, not concern for DIC.  Of note she had an elevated D-dimer 1.25 likely related to associated infection.  There is no current availability of ultrasound however will be available in 2 hours.  Plan to obtain ultrasound and reassess patient with daytime doctor to ensure no progression of cellulitis.  If no progression plan to discharge with antibiotics and follow-up with PCP with strict return precautions.  If progression, plan to admit for continued IV antibiotics.  On my current re-evaluation, no progression of cellulitis. [VB]    Clinical Course User Index [VB] Mardene Sayer, MD                             Medical Decision Making HILDY FARIES is a 37 y.o. female.  With PMH of asthma who presents with right ankle and leg pain with associated redness and swelling over the past two days.   Patient has poorly demarcated region of erythema and warmth and tenderness of the right ankle and shin that can be seen  in media.  There is also associated streaking up the anterior shin concerning for lymphangitic streaking.  She has tolerated 2 small pinpoint bite-like region on anterior ankle concerning for possible insect bite.  Patient denying snake bite.  Suspect likely cellulitis resulting from likely insect bite.  Exam not consistent with septic arthritis of right ankle as she has full range of motion intact of right ankle without severe pain.  She is neurovascularly intact, not concerned for ischemic limb.  She presented hypertensive and tachycardic but more likely related to pain.  She is not febrile, doubt necrotizing ssti or sepsis. Additionally, lactate 0.9 and reassuring.  X-ray obtained which I personally reviewed no fracture, no dislocation no evidence of osteomyelitis.  Patient's labs notable for leukocytosis 15.4 with left shift likely due to infection and suspected cellulitis.  Elevated platelet count 462 likely reactive from acute infection.  Normal coags, not concern for DIC.  Of note she had an elevated D-dimer 1.25 likely related to associated infection.  There is no current availability of ultrasound however will be available in 2 hours.  Plan to obtain ultrasound and reassess patient with daytime doctor to ensure no progression of cellulitis.  If no progression plan to discharge with antibiotics and follow-up with PCP with strict return precautions.  If progression, plan to admit for continued IV antibiotics.  On my current re-evaluation, no progression of cellulitis. [VB]  S/o to Dr Criss Alvine pending RLE Duplex and reassessment. Likely d/c with antibiotics and PCP f/u.    Amount and/or Complexity of Data Reviewed Labs: ordered. Radiology: ordered.  Risk Prescription drug management.     Final Clinical Impression(s) / ED Diagnoses Final diagnoses:  Cellulitis of right lower extremity    Rx / DC Orders ED Discharge Orders          Ordered    cefadroxil (DURICEF) 500 MG capsule  2  times daily        10/14/22 0505    doxycycline (VIBRAMYCIN) 100 MG capsule  2 times daily        10/14/22 0505    hydrOXYzine (ATARAX) 25 MG tablet  Every 6 hours PRN        10/14/22 0505  Mardene Sayer, MD 10/14/22 [phone]

## 2022-10-14 NOTE — ED Triage Notes (Signed)
Patient arrives with swelling to the right ankle. Ankle is red and hot to touch. Patient states she thinks she may have gotten bit by ants. Pain is rated 10/10.

## 2022-10-14 NOTE — ED Provider Notes (Signed)
Care transferred to me.  Patient did complain of some increased pain and she has some significant swelling around her ankle.  Unclear if this is cellulitis versus an allergic reaction with swelling.  She was given some pain control, ice was applied and her leg was elevated and she is doing better.  Her DVT ultrasound is negative.  Will cover for cellulitis and will also give hydroxyzine for itching.  I have discussed the importance of elevating her leg.  She was given a work note.  We discussed return precautions but at this point I have low suspicion for deep space infection, sepsis or bacteremia.  Will discharge home with return precautions.  Strong DP pulse appreciated.   Pricilla Loveless, MD 10/14/22 [phone]

## 2022-10-14 NOTE — Progress Notes (Signed)
Right lower extremity venous duplex has been completed. Preliminary results can be found in CV Proc through chart review.  Results were given to Dr. Criss Alvine.  10/14/22 8:31 AM Olen Cordial RVT

## 2022-10-19 LAB — CULTURE, BLOOD (ROUTINE X 2)
Culture: NO GROWTH
Culture: NO GROWTH
Special Requests: ADEQUATE

## 2023-03-20 ENCOUNTER — Emergency Department (HOSPITAL_COMMUNITY)
Admission: EM | Admit: 2023-03-20 | Discharge: 2023-03-21 | Disposition: A | Discharge status: Home / Self Care | Payer: Self-pay | Attending: Emergency Medicine | Admitting: Emergency Medicine

## 2023-03-20 DIAGNOSIS — T7840XA Allergy, unspecified, initial encounter: Secondary | ICD-10-CM | POA: Insufficient documentation

## 2023-03-20 MED ORDER — DIPHENHYDRAMINE HCL 25 MG PO CAPS
50.0000 mg | ORAL_CAPSULE | Freq: Once | ORAL | Status: AC
  Administered 2023-03-20: 50 mg via ORAL
  Filled 2023-03-20: qty 2

## 2023-03-20 MED ORDER — FAMOTIDINE IN NACL 20-0.9 MG/50ML-% IV SOLN
20.0000 mg | Freq: Once | INTRAVENOUS | Status: AC
  Administered 2023-03-20: 20 mg via INTRAVENOUS
  Filled 2023-03-20: qty 50

## 2023-03-20 MED ORDER — METHYLPREDNISOLONE SODIUM SUCC 125 MG IJ SOLR
125.0000 mg | Freq: Once | INTRAMUSCULAR | Status: AC
  Administered 2023-03-20: 125 mg via INTRAVENOUS
  Filled 2023-03-20: qty 2

## 2023-03-20 NOTE — ED Triage Notes (Signed)
Patient here from home reporting allergic reaction unknown agent. Reports itching all over including tongue. Does report taking Cold and Flu Relief [employer] today. Allergy to Penicillin.

## 2023-03-20 NOTE — ED Provider Notes (Signed)
EMERGENCY DEPARTMENT AT [facility] Provider Note   CSN: [id number] Arrival date & time: 03/20/23  2228     History {Add pertinent medical, surgical, social history, OB history to HPI:1} Chief Complaint  Patient presents with   Allergic Reaction    Brittney Henderson is a 37 y.o. female.  The history is provided by the patient and medical records.  Allergic Reaction Presenting symptoms: rash        Home Medications Prior to Admission medications   Medication Sig Start Date End Date Taking? Authorizing Provider  albuterol (PROVENTIL HFA;VENTOLIN HFA) 108 (90 Base) MCG/ACT inhaler Inhale 2 puffs into the lungs every 4 (four) hours as needed for wheezing or shortness of breath. 07/28/16   Cathren Laine, MD  albuterol (PROVENTIL) (2.5 MG/3ML) 0.083% nebulizer solution Take 3 mLs (2.5 mg total) by nebulization every 6 (six) hours as needed for wheezing or shortness of breath. 06/02/22   Gailen Shelter, PA  azithromycin (ZITHROMAX Z-PAK) 250 MG tablet Take 2 tablets now and then one tablet PO daily for infection 07/28/16   Janne Napoleon, NP  clindamycin (CLEOCIN) 150 MG capsule Take 2 capsules (300 mg total) by mouth 3 (three) times daily. 03/17/16   Arthor Captain, PA-C  HYDROcodone-acetaminophen (NORCO) 5-325 MG tablet Take 1-2 tablets by mouth every 6 (six) hours as needed for moderate pain. 03/17/16   Arthor Captain, PA-C  hydrOXYzine (ATARAX) 25 MG tablet Take 1 tablet (25 mg total) by mouth every 6 (six) hours as needed for itching. 10/14/22   Mardene Sayer, MD  ibuprofen (ADVIL,MOTRIN) 800 MG tablet Take 1 tablet (800 mg total) by mouth 3 (three) times daily. 10/22/15   Gilda Crease, MD  medroxyPROGESTERone (PROVERA) 10 MG tablet Take 1 tablet (10 mg total) by mouth daily. 10/22/15   Gilda Crease, MD  naphazoline-glycerin (CLEAR EYES) 0.012-0.2 % SOLN Place 1-2 drops into the right eye daily as needed. For redness    [provider]  naproxen sodium (ANAPROX) 220 MG tablet Take 880 mg by mouth once.    [provider]  potassium chloride SA (KLOR-CON) 20 MEQ tablet Take 1 tablet (20 mEq total) by mouth 2 (two) times daily for 3 days. 02/15/21 02/18/21  Georga Hacking, MD  predniSONE (DELTASONE) 10 MG tablet Take 2 tablets (20 mg total) by mouth daily. 06/02/22   Gailen Shelter, PA  traMADol (ULTRAM) 50 MG tablet Take 1 tablet (50 mg total) by mouth every 6 (six) hours as needed. 10/22/15   Gilda Crease, MD      Allergies    Penicillins    Review of Systems   Review of Systems  Skin:  Positive for rash.  All other systems reviewed and are negative.   Physical Exam Updated Vital Signs BP (!) 143/99   Pulse 95   Temp 98 F (36.7 C) (Oral)   Resp 18   SpO2 99%   Physical Exam Vitals and nursing note reviewed.  Constitutional:      Appearance: She is well-developed.  HENT:     Head: Normocephalic and atraumatic.  Eyes:     Conjunctiva/sclera: Conjunctivae normal.     Pupils: Pupils are equal, round, and reactive to light.  Cardiovascular:     Rate and Rhythm: Normal rate and regular rhythm.     Heart sounds: Normal heart sounds.  Pulmonary:     Effort: Pulmonary effort is normal. No respiratory distress.  Breath sounds: Normal breath sounds. No rhonchi.  Abdominal:     General: Bowel sounds are normal.     Palpations: Abdomen is soft.  Musculoskeletal:        General: Normal range of motion.     Cervical back: Normal range of motion.  Skin:    General: Skin is warm and dry.  Neurological:     Mental Status: She is alert and oriented to person, place, and time.     ED Results / Procedures / Treatments   Labs (all labs ordered are listed, but only abnormal results are displayed) Labs Reviewed - No data to display  EKG None  Radiology No results found.  Procedures Procedures  {Document cardiac monitor, telemetry assessment procedure when  appropriate:1}  Medications Ordered in ED Medications  methylPREDNISolone sodium succinate (SOLU-MEDROL) 125 mg/2 mL injection 125 mg (has no administration in time range)  famotidine (PEPCID) IVPB 20 mg premix (has no administration in time range)  diphenhydrAMINE (BENADRYL) capsule 50 mg (50 mg Oral Given 03/20/23 2244)    ED Course/ Medical Decision Making/ A&P   {   Click here for ABCD2, HEART and other calculatorsREFRESH Note before signing :1}                              Medical Decision Making Risk Prescription drug management.   ***  {Document critical care time when appropriate:1} {Document review of labs and clinical decision tools ie heart score, Chads2Vasc2 etc:1}  {Document your independent review of radiology images, and any outside records:1} {Document your discussion with family members, caretakers, and with consultants:1} {Document social determinants of health affecting pt's care:1} {Document your decision making why or why not admission, treatments were needed:1} Final Clinical Impression(s) / ED Diagnoses Final diagnoses:  None    Rx / DC Orders ED Discharge Orders     None

## 2023-03-21 MED ORDER — PREDNISONE 20 MG PO TABS: ORAL_TABLET | ORAL | 0 refills | Status: DC

## 2023-03-21 MED ORDER — DIPHENHYDRAMINE HCL 25 MG PO TABS: 25.0000 mg | ORAL_TABLET | Freq: Four times a day (QID) | ORAL | 0 refills | Status: DC

## 2023-03-21 NOTE — Discharge Instructions (Signed)
Take the prescribed medication as directed. °Follow-up with your primary care doctor. °Return to the ED for new or worsening symptoms. °

## 2023-06-11 ENCOUNTER — Other Ambulatory Visit: Payer: Self-pay

## 2023-06-11 ENCOUNTER — Other Ambulatory Visit (HOSPITAL_COMMUNITY): Payer: Self-pay

## 2023-06-11 ENCOUNTER — Ambulatory Visit: Payer: Self-pay | Attending: Family Medicine | Admitting: Family Medicine

## 2023-06-11 ENCOUNTER — Encounter: Payer: Self-pay | Admitting: Family Medicine

## 2023-06-11 VITALS — BP 135/82 | HR 92 | Ht 66.0 in | Wt 145.2 lb

## 2023-06-11 DIAGNOSIS — G43809 Other migraine, not intractable, without status migrainosus: Secondary | ICD-10-CM

## 2023-06-11 DIAGNOSIS — Z13228 Encounter for screening for other metabolic disorders: Secondary | ICD-10-CM

## 2023-06-11 DIAGNOSIS — G43909 Migraine, unspecified, not intractable, without status migrainosus: Secondary | ICD-10-CM | POA: Insufficient documentation

## 2023-06-11 DIAGNOSIS — M940 Chondrocostal junction syndrome [Tietze]: Secondary | ICD-10-CM

## 2023-06-11 DIAGNOSIS — R0981 Nasal congestion: Secondary | ICD-10-CM

## 2023-06-11 MED ORDER — RIZATRIPTAN BENZOATE 10 MG PO TABS
10.0000 mg | ORAL_TABLET | ORAL | 0 refills | Status: DC | PRN
  Filled 2023-06-11: qty 10, 30d supply, fill #0

## 2023-06-11 MED ORDER — TOPIRAMATE 25 MG PO TABS
ORAL_TABLET | ORAL | 3 refills | Status: AC
  Filled 2023-06-11 (×3): qty 67, 37d supply, fill #0

## 2023-06-11 MED ORDER — CETIRIZINE HCL 10 MG PO TABS
10.0000 mg | ORAL_TABLET | Freq: Every day | ORAL | 1 refills | Status: AC
  Filled 2023-06-11: qty 30, 30d supply, fill #0
  Filled 2023-09-06 – 2023-12-10 (×3): qty 30, 30d supply, fill #1

## 2023-06-11 MED ORDER — DICLOFENAC SODIUM 1 % EX GEL
4.0000 g | Freq: Four times a day (QID) | CUTANEOUS | 1 refills | Status: AC
Start: 2023-06-11 — End: ?
  Filled 2023-06-11: qty 100, 7d supply, fill #0
  Filled 2023-09-25: qty 100, 7d supply, fill #1

## 2023-06-11 NOTE — Patient Instructions (Signed)
 Costochondritis  Costochondritis is irritation and swelling (inflammation) of the tissue that connects the ribs to the breastbone (sternum). This tissue is called cartilage. This condition causes pain in the front of the chest. The pain often starts slowly. It may be in more than one rib. What are the causes? The cause of this condition is not always known. It can come from stress on the sternum. The cause of this stress could be: Chest injury. Exercise or activity. This may include lifting. Very bad coughing. What increases the risk? Being female. Being 25-56 years old. Starting a new exercise or work activity. Having low levels of vitamin D. Having a condition that makes you cough a lot. What are the signs or symptoms? Chest pain that: Starts slowly. It can be sharp or dull. Gets worse with deep breathing, coughing, or exercise. Gets better with rest. May be worse when you press on your ribs and breastbone. How is this treated? In most cases, this condition goes away on its own over time. You may need to take an NSAID, such as ibuprofen. This can help reduce pain. You may also need to: Rest and stay away from activities that make pain worse. Put heat or ice on the area that hurts. Do exercises to stretch your chest muscles. If these treatments do not help, your doctor may inject a medicine to numb the area. This can help relieve the pain. Follow these instructions at home: Managing pain, stiffness, and swelling     If told, put ice on the painful area. To do this: Put ice in a plastic bag. Place a towel between your skin and the bag. Leave the ice on for 20 minutes, 2-3 times a day. If told, put heat on the affected area. Do this as often as told by your doctor. Use the heat source that your doctor recommends, such as a moist heat pack or a heating pad. Place a towel between your skin and the heat source. Leave the heat on for 20-30 minutes. If your skin turns bright red,  take off the ice or heat right away to prevent skin damage. The risk of skin damage is higher if you cannot feel pain, heat, or cold. Activity Rest as told by your doctor. Do not do things that make your pain worse. This includes activities that use your chest, belly (abdomen), and side muscles. You may have to avoid lifting. Ask your doctor how much you can safely lift. Return to your normal activities when your doctor says that it is safe. General instructions Take over-the-counter and prescription medicines only as told by your doctor. Contact a doctor if: You have chills or a fever. Your pain does not go away or gets worse. You have a cough that does not go away. Get help right away if: You have a hard time breathing. You have very bad chest pain that does not get better with medicines, heat, or ice. These symptoms may be an emergency. Get help right away. Call 911. Do not wait to see if the symptoms will go away. Do not drive yourself to the hospital. This information is not intended to replace advice given to you by your health care provider. Make sure you discuss any questions you have with your health care provider. Document Revised: 12/05/2021 Document Reviewed: 12/05/2021 Elsevier Patient Education  2024 [employer].

## 2023-06-11 NOTE — Progress Notes (Signed)
 Subjective:  Patient ID: Brittney Henderson, female    DOB: 09/07/1985  Age: 38 y.o. MRN: [id number]  CC: New Patient (Initial Visit) (Headaches/Occasional chest pain)   HPI Brittney Henderson is a 38 y.o. year old female with no significant past medical history. She presents to establish care.  Interval History: Discussed the use of AI scribe software for clinical note transcription with the patient, who gave verbal consent to proceed.  She presents with headaches and left-sided chest pain. The headaches have been occurring frequently for the past three weeks, often at night, and are described as moderate to severe. The pain is localized to one spot on the head, and the patient reports no associated symptoms such as light sensitivity, nausea, vomiting, or neck pain. The headaches are debilitating enough to interfere with work, and the patient finds relief in a dark room or by applying hot rags to the face. Over-the-counter migraine Excedrin provides variable relief. The headaches occur four to five times a week and onset is sudden.  The patient also reports left-sided chest pain, described as a sharp pain felt upon breathing. The pain occurs every other day or every three days, and is not associated with heavy lifting. The pain is worse upon palpation and feels sore. The patient finds slight relief from applying warm water in the shower.     She also Complains of nasal congestion.   Past Medical History:  Diagnosis Date   Asthma     No past surgical history on file.  No family history on file.  Social History   Socioeconomic History   Marital status: Single    Spouse name: Not on file   Number of children: Not on file   Years of education: Not on file   Highest education level: Not on file  Occupational History   Not on file  Tobacco Use   Smoking status: Every Day    Current packs/day: 0.25    Types: Cigarettes   Smokeless tobacco: Never  Substance and Sexual Activity    Alcohol use: Yes    Comment: 1-2 times a week.    Drug use: No   Sexual activity: Not on file  Other Topics Concern   Not on file  Social History Narrative   Not on file   Social Drivers of Health   Financial Resource Strain: Not on file  Food Insecurity: Not on file  Transportation Needs: Not on file  Physical Activity: Not on file  Stress: Not on file  Social Connections: Not on file    Allergies  Allergen Reactions   Penicillins Other (See Comments)    Unknown from childhood    Outpatient Medications Prior to Visit  Medication Sig Dispense Refill   albuterol  (PROVENTIL  HFA;VENTOLIN  HFA) 108 (90 Base) MCG/ACT inhaler Inhale 2 puffs into the lungs every 4 (four) hours as needed for wheezing or shortness of breath. 1 Inhaler 2   albuterol  (PROVENTIL ) (2.5 MG/3ML) 0.083% nebulizer solution Take 3 mLs (2.5 mg total) by nebulization every 6 (six) hours as needed for wheezing or shortness of breath. 75 mL 12   potassium chloride  SA (KLOR-CON ) 20 MEQ tablet Take 1 tablet (20 mEq total) by mouth 2 (two) times daily for 3 days. 6 tablet 0   azithromycin  (ZITHROMAX  Z-PAK) 250 MG tablet Take 2 tablets now and then one tablet PO daily for infection (Patient not taking: Reported on 06/11/2023) 6 tablet 0   clindamycin  (CLEOCIN ) 150 MG capsule Take 2 capsules (  300 mg total) by mouth 3 (three) times daily. (Patient not taking: Reported on 06/11/2023) 60 capsule 0   diphenhydrAMINE  (BENADRYL ) 25 MG tablet Take 1 tablet (25 mg total) by mouth every 6 (six) hours. (Patient not taking: Reported on 06/11/2023) 20 tablet 0   HYDROcodone -acetaminophen  (NORCO) 5-325 MG tablet Take 1-2 tablets by mouth every 6 (six) hours as needed for moderate pain. (Patient not taking: Reported on 06/11/2023) 10 tablet 0   hydrOXYzine  (ATARAX ) 25 MG tablet Take 1 tablet (25 mg total) by mouth every 6 (six) hours as needed for itching. (Patient not taking: Reported on 06/11/2023) 12 tablet 0   ibuprofen  (ADVIL ,MOTRIN ) 800 MG  tablet Take 1 tablet (800 mg total) by mouth 3 (three) times daily. (Patient not taking: Reported on 06/11/2023) 21 tablet 0   medroxyPROGESTERone  (PROVERA ) 10 MG tablet Take 1 tablet (10 mg total) by mouth daily. (Patient not taking: Reported on 06/11/2023) 7 tablet 0   naphazoline-glycerin (CLEAR EYES) 0.012-0.2 % SOLN Place 1-2 drops into the right eye daily as needed. For redness (Patient not taking: Reported on 06/11/2023)     naproxen sodium (ANAPROX) 220 MG tablet Take 880 mg by mouth once. (Patient not taking: Reported on 06/11/2023)     predniSONE  (DELTASONE ) 20 MG tablet Take 40 mg by mouth daily for 3 days, then 20mg  by mouth daily for 3 days, then 10mg  daily for 3 days (Patient not taking: Reported on 06/11/2023) 12 tablet 0   traMADol  (ULTRAM ) 50 MG tablet Take 1 tablet (50 mg total) by mouth every 6 (six) hours as needed. (Patient not taking: Reported on 06/11/2023) 15 tablet 0   No facility-administered medications prior to visit.     ROS Review of Systems  Constitutional:  Negative for activity change and appetite change.  HENT:  Positive for congestion. Negative for sinus pressure and sore throat.   Respiratory:  Negative for chest tightness, shortness of breath and wheezing.   Cardiovascular:  Negative for chest pain and palpitations.  Gastrointestinal:  Negative for abdominal distention, abdominal pain and constipation.  Genitourinary: Negative.   Musculoskeletal: Negative.   Neurological:  Positive for headaches.  Psychiatric/Behavioral:  Negative for behavioral problems and dysphoric mood.     Objective:  BP 135/82   Pulse 92   Ht 5' 6 (1.676 m)   Wt 145 lb 3.2 oz (65.9 kg)   SpO2 100%   BMI 23.44 kg/m      06/11/2023   10:06 AM 06/11/2023    9:11 AM 03/21/2023    3:30 AM  BP/Weight  Systolic BP 135 141 106  Diastolic BP 82 88 69  Wt. (Lbs)  145.2   BMI  23.44 kg/m2       Physical Exam Constitutional:      Appearance: She is well-developed.  Cardiovascular:      Rate and Rhythm: Normal rate.     Heart sounds: Normal heart sounds. No murmur heard. Pulmonary:     Effort: Pulmonary effort is normal.     Breath sounds: Normal breath sounds. No wheezing or rales.  Chest:     Chest wall: Tenderness (reproducible on the left side) present.  Abdominal:     General: Bowel sounds are normal. There is no distension.     Palpations: Abdomen is soft. There is no mass.     Tenderness: There is no abdominal tenderness.  Musculoskeletal:        General: Normal range of motion.     Right lower leg:  No edema.     Left lower leg: No edema.  Neurological:     Mental Status: She is alert and oriented to person, place, and time.  Psychiatric:        Mood and Affect: Mood normal.        Latest Ref Rng & Units 10/14/2022    3:04 AM 02/14/2021    9:13 PM 10/21/2015   11:57 PM  CMP  Glucose 70 - 99 mg/dL 877  866  92   BUN 6 - 20 mg/dL 6  7  13    Creatinine 0.44 - 1.00 mg/dL 9.34  9.29  9.16   Sodium 135 - 145 mmol/L 135  133  139   Potassium 3.5 - 5.1 mmol/L 3.5  3.0  3.4   Chloride 98 - 111 mmol/L 104  102  107   CO2 22 - 32 mmol/L 23  22  25    Calcium 8.9 - 10.3 mg/dL 8.5  8.5  8.6   Total Protein 6.5 - 8.1 g/dL  7.4  7.3   Total Bilirubin 0.3 - 1.2 mg/dL  0.5  0.2   Alkaline Phos 38 - 126 U/L  68  55   AST 15 - 41 U/L  28  23   ALT 0 - 44 U/L  17  19     Lipid Panel  No results found for: CHOL, TRIG, HDL, CHOLHDL, VLDL, LDLCALC, LDLDIRECT  CBC    Component Value Date/Time   WBC 15.4 (H) 10/14/2022 0304   RBC 4.00 10/14/2022 0304   HGB 11.6 (L) 10/14/2022 0304   HCT 36.5 10/14/2022 0304   PLT 462 (H) 10/14/2022 0304   MCV 91.3 10/14/2022 0304   MCH 29.0 10/14/2022 0304   MCHC 31.8 10/14/2022 0304   RDW 15.1 10/14/2022 0304   LYMPHSABS 2.8 10/14/2022 0304   MONOABS 0.7 10/14/2022 0304   EOSABS 0.3 10/14/2022 0304   BASOSABS 0.0 10/14/2022 0304    No results found for: HGBA1C  Assessment & Plan:      Migraine  Headaches Frequent headaches (4-5 times per week) for the past 3 weeks, moderate to severe in intensity, located on the right side of the head. No associated photophobia, nausea, vomiting, or neck pain. Partial relief with Excedrin Migraine. -Start Topamax  25mg  daily for one week, then increase to twice daily for migraine prophylaxis. -Provide Maxalt  for abortive treatment as needed.  Costochondritis Left-sided chest pain, exacerbated by deep breathing and palpation, no heavy lifting. Normal EKG. -Prescribe Voltaren  gel for topical application.  Sinus Congestion Recent onset, likely due to seasonal changes. -Prescribe Zyrtec .  General Health Maintenance -Order blood work for routine check-up. -Repeat blood pressure measurement due to initial elevated reading.          Meds ordered this encounter  Medications   topiramate  (TOPAMAX ) 25 MG tablet    Sig: Take 1 tablet (25 mg total) by mouth daily for 7 days, THEN 1 tablet (25 mg total) 2 (two) times daily.    Dispense:  67 tablet    Refill:  3   rizatriptan  (MAXALT ) 10 MG tablet    Sig: Take 1 tablet (10 mg total) by mouth as needed for migraine. May repeat in 2 hours if needed    Dispense:  10 tablet    Refill:  0   cetirizine  (ZYRTEC ) 10 MG tablet    Sig: Take 1 tablet (10 mg total) by mouth daily.    Dispense:  30 tablet  Refill:  1   diclofenac  Sodium (VOLTAREN ) 1 % GEL    Sig: Apply 4 g topically 4 (four) times daily.    Dispense:  100 g    Refill:  1    Follow-up: Return in about 3 months (around 09/09/2023) for migraine.       Corrina Sabin, MD, FAAFP. [facility] [place], [place] [phone]   06/11/2023, 10:30 AM

## 2023-06-12 ENCOUNTER — Other Ambulatory Visit: Payer: Self-pay

## 2023-06-12 LAB — CMP14+EGFR
ALT: 14 [IU]/L (ref 0–32)
AST: 19 [IU]/L (ref 0–40)
Albumin: 4.3 g/dL (ref 3.9–4.9)
Alkaline Phosphatase: 73 [IU]/L (ref 44–121)
BUN/Creatinine Ratio: 6 — ABNORMAL LOW (ref 9–23)
BUN: 4 mg/dL — ABNORMAL LOW (ref 6–20)
Bilirubin Total: 0.3 mg/dL (ref 0.0–1.2)
CO2: 23 mmol/L (ref 20–29)
Calcium: 9.2 mg/dL (ref 8.7–10.2)
Chloride: 103 mmol/L (ref 96–106)
Creatinine, Ser: 0.7 mg/dL (ref 0.57–1.00)
Globulin, Total: 2.3 g/dL (ref 1.5–4.5)
Glucose: 55 mg/dL — ABNORMAL LOW (ref 70–99)
Potassium: 3.9 mmol/L (ref 3.5–5.2)
Sodium: 143 mmol/L (ref 134–144)
Total Protein: 6.6 g/dL (ref 6.0–8.5)
eGFR: 113 mL/min/{1.73_m2} (ref >59)

## 2023-06-12 LAB — CBC WITH DIFFERENTIAL/PLATELET
Basophils Absolute: 0.1 10*3/uL (ref 0.0–0.2)
Basos: 1 %
EOS (ABSOLUTE): 0.6 10*3/uL — ABNORMAL HIGH (ref 0.0–0.4)
Eos: 5 %
Hematocrit: 36.8 % (ref 34.0–46.6)
Hemoglobin: 11.9 g/dL (ref 11.1–15.9)
Immature Grans (Abs): 0 10*3/uL (ref 0.0–0.1)
Immature Granulocytes: 0 %
Lymphocytes Absolute: 3.2 10*3/uL — ABNORMAL HIGH (ref 0.7–3.1)
Lymphs: 25 %
MCH: 29.2 pg (ref 26.6–33.0)
MCHC: 32.3 g/dL (ref 31.5–35.7)
MCV: 90 fL (ref 79–97)
Monocytes Absolute: 0.9 10*3/uL (ref 0.1–0.9)
Monocytes: 7 %
Neutrophils Absolute: 8 10*3/uL — ABNORMAL HIGH (ref 1.4–7.0)
Neutrophils: 62 %
Platelets: 508 10*3/uL — ABNORMAL HIGH (ref 150–450)
RBC: 4.07 x10E6/uL (ref 3.77–5.28)
RDW: 13.7 % (ref 11.7–15.4)
WBC: 12.8 10*3/uL — ABNORMAL HIGH (ref 3.4–10.8)

## 2023-06-12 LAB — LP+NON-HDL CHOLESTEROL
Cholesterol, Total: 161 mg/dL (ref 100–199)
HDL: 48 mg/dL (ref >39)
LDL Chol Calc (NIH): 92 mg/dL (ref 0–99)
Total Non-HDL-Chol (LDL+VLDL): 113 mg/dL (ref 0–129)
Triglycerides: 114 mg/dL (ref 0–149)
VLDL Cholesterol Cal: 21 mg/dL (ref 5–40)

## 2023-06-12 LAB — HEMOGLOBIN A1C
Est. average glucose Bld gHb Est-mCnc: 108 mg/dL
Hgb A1c MFr Bld: 5.4 % (ref 4.8–5.6)

## 2023-06-19 ENCOUNTER — Other Ambulatory Visit: Payer: Self-pay

## 2023-07-20 ENCOUNTER — Encounter: Payer: Self-pay | Admitting: Family Medicine

## 2023-09-06 ENCOUNTER — Other Ambulatory Visit: Payer: Self-pay | Admitting: Family Medicine

## 2023-09-07 ENCOUNTER — Other Ambulatory Visit: Payer: Self-pay

## 2023-09-08 ENCOUNTER — Other Ambulatory Visit: Payer: Self-pay

## 2023-09-08 MED ORDER — RIZATRIPTAN BENZOATE 10 MG PO TABS
10.0000 mg | ORAL_TABLET | ORAL | 0 refills | Status: AC | PRN
  Filled 2023-09-08 – 2023-12-10 (×3): qty 10, 30d supply, fill #0

## 2023-09-09 ENCOUNTER — Ambulatory Visit: Payer: Self-pay | Admitting: Family Medicine

## 2023-09-16 ENCOUNTER — Other Ambulatory Visit: Payer: Self-pay

## 2023-09-17 ENCOUNTER — Other Ambulatory Visit: Payer: Self-pay

## 2023-09-23 ENCOUNTER — Ambulatory Visit: Payer: Self-pay | Admitting: Family Medicine

## 2023-09-25 ENCOUNTER — Other Ambulatory Visit: Payer: Self-pay

## 2023-10-06 ENCOUNTER — Other Ambulatory Visit: Payer: Self-pay

## 2023-11-26 ENCOUNTER — Ambulatory Visit
Admission: EM | Admit: 2023-11-26 | Discharge: 2023-11-26 | Disposition: A | Discharge status: Home / Self Care | Payer: Self-pay | Attending: Family Medicine | Admitting: Family Medicine

## 2023-11-26 DIAGNOSIS — L301 Dyshidrosis [pompholyx]: Secondary | ICD-10-CM

## 2023-11-26 MED ORDER — TRIAMCINOLONE ACETONIDE 0.1 % EX CREA: 1.0000 | TOPICAL_CREAM | Freq: Two times a day (BID) | CUTANEOUS | 0 refills | Status: AC

## 2023-11-26 NOTE — ED Provider Notes (Signed)
 [facility] URG[facility]  Note:  This document was prepared using Conservation officer, historic buildings and may include unintentional dictation errors.  MRN: [id number] DOB: 31-May-1986  Subjective:   Brittney Henderson is a 38 y.o. female presenting for 2 to 3-day history of persistent itchy bumps over her the hands and fingers.  Patient has a history of eczema and has bouts of this type of rash.  She tries avoid using sanitizer at work.  However, there is a soap that they have to use that worsens her rash.  She does try to wear gloves.  No fever, drainage, bleeding, tenderness.  Has not used any medical interventions.  No current facility-administered medications for this encounter.  Current Outpatient Medications:    albuterol  (PROVENTIL  HFA;VENTOLIN  HFA) 108 (90 Base) MCG/ACT inhaler, Inhale 2 puffs into the lungs every 4 (four) hours as needed for wheezing or shortness of breath., Disp: 1 Inhaler, Rfl: 2   albuterol  (PROVENTIL ) (2.5 MG/3ML) 0.083% nebulizer solution, Take 3 mLs (2.5 mg total) by nebulization every 6 (six) hours as needed for wheezing or shortness of breath., Disp: 75 mL, Rfl: 12   cetirizine  (ZYRTEC ) 10 MG tablet, Take 1 tablet (10 mg total) by mouth daily., Disp: 30 tablet, Rfl: 1   diclofenac  Sodium (VOLTAREN ) 1 % GEL, Apply 4 g topically 4 (four) times daily., Disp: 100 g, Rfl: 1   potassium chloride  SA (KLOR-CON ) 20 MEQ tablet, Take 1 tablet (20 mEq total) by mouth 2 (two) times daily for 3 days., Disp: 6 tablet, Rfl: 0   rizatriptan  (MAXALT ) 10 MG tablet, Take 1 tablet (10 mg total) by mouth as needed for migraine. May repeat in 2 hours if needed, Disp: 10 tablet, Rfl: 0   topiramate  (TOPAMAX ) 25 MG tablet, Take 1 tablet (25 mg total) by mouth daily for 7 days, THEN 1 tablet (25 mg total) 2 (two) times daily., Disp: 67 tablet, Rfl: 3   Allergies  Allergen Reactions   Penicillins Other (See Comments)    Unknown from childhood    Past Medical History:   Diagnosis Date   Asthma      History reviewed. No pertinent surgical history.  History reviewed. No pertinent family history.  Social History   Tobacco Use   Smoking status: Every Day    Current packs/day: 0.25    Types: Cigarettes   Smokeless tobacco: Never  Substance Use Topics   Alcohol use: Yes    Comment: 1-2 times a week.    Drug use: No    ROS   Objective:   Vitals: BP (!) 150/90 (BP Location: Right Arm)   Pulse (!) 121   Temp 98 F (36.7 C) (Oral)   Resp 18   LMP 11/19/2023 (Exact Date)   SpO2 97%   Physical Exam Constitutional:      General: She is not in acute distress.    Appearance: Normal appearance. She is well-developed. She is not ill-appearing, toxic-appearing or diaphoretic.  HENT:     Head: Normocephalic and atraumatic.     Nose: Nose normal.     Mouth/Throat:     Mouth: Mucous membranes are moist.   Eyes:     General: No scleral icterus.       Right eye: No discharge.        Left eye: No discharge.     Extraocular Movements: Extraocular movements intact.    Cardiovascular:     Rate and Rhythm: Normal rate.  Pulmonary:  Effort: Pulmonary effort is normal.   Musculoskeletal:     Comments: Dry scaly papular lesions overlying the dorsal aspects of both hands worse on the left.   Skin:    General: Skin is warm and dry.   Neurological:     General: No focal deficit present.     Mental Status: She is alert and oriented to person, place, and time.   Psychiatric:        Mood and Affect: Mood normal.        Behavior: Behavior normal.     Assessment and Plan :   PDMP not reviewed this encounter.  1. Dyshidrotic eczema    Physical exam findings consistent with dyshidrotic eczema.  Recommended topical steroid, avoidance of exacerbating factors.  Counseled patient on potential for adverse effects with medications prescribed/recommended today, ER and return-to-clinic precautions discussed, patient verbalized understanding.     Christopher Savannah, [place] 11/26/23 [id number]

## 2023-11-26 NOTE — ED Triage Notes (Addendum)
 Pt states itchy  rash to her hand on and off for a few days.

## 2023-12-10 ENCOUNTER — Other Ambulatory Visit: Payer: Self-pay

## 2023-12-21 ENCOUNTER — Other Ambulatory Visit: Payer: Self-pay

## 2024-01-19 ENCOUNTER — Telehealth: Payer: Self-pay | Admitting: Family Medicine

## 2024-01-19 NOTE — Telephone Encounter (Signed)
 Pt unconfirmed appt 8/19 Lvm

## 2024-01-20 ENCOUNTER — Encounter: Payer: Self-pay | Admitting: Family Medicine
